# Patient Record
Sex: Male | Born: 1980 | Hispanic: Yes | Marital: Married | Smoking: Current some day smoker
Health system: Southern US, Community
[De-identification: ages and names within clinical notes are randomized; demographics above are authoritative.]

---

## 2013-12-05 ENCOUNTER — Ambulatory Visit (INDEPENDENT_AMBULATORY_CARE_PROVIDER_SITE_OTHER): Payer: BC Managed Care – PPO | Admitting: Family Medicine

## 2013-12-05 VITALS — BP 110/65 | HR 54 | Temp 97.8°F | Resp 18 | Ht 70.5 in | Wt 221.0 lb

## 2013-12-05 DIAGNOSIS — N390 Urinary tract infection, site not specified: Secondary | ICD-10-CM

## 2013-12-05 DIAGNOSIS — R300 Dysuria: Secondary | ICD-10-CM

## 2013-12-05 LAB — POCT URINALYSIS DIPSTICK
Glucose, UA: NEGATIVE
Ketones, UA: 15
NITRITE UA: POSITIVE
PH UA: 5.5
Protein, UA: 30
Spec Grav, UA: >=1.03
UROBILINOGEN UA: 1

## 2013-12-05 LAB — POCT UA - MICROSCOPIC ONLY
CASTS, UR, LPF, POC: NEGATIVE
Crystals, Ur, HPF, POC: NEGATIVE
YEAST UA: NEGATIVE

## 2013-12-05 MED ORDER — CEFTRIAXONE SODIUM 1 G IJ SOLR
1.0000 g | Freq: Once | INTRAMUSCULAR | Status: AC
  Administered 2013-12-05: 1 g via INTRAMUSCULAR

## 2013-12-05 MED ORDER — AZITHROMYCIN 500 MG PO TABS: 1000.0000 mg | ORAL_TABLET | Freq: Once | ORAL | Status: DC

## 2013-12-05 NOTE — Progress Notes (Addendum)
Subjective:    Patient ID: Andrew Parrish, male    DOB: 01/23/1981, 33 y.o.   MRN: [id number]  HPI Has had pain with urination for 5 days. Has noticed increased frequency, sometimes only voiding small amounts. Patient is married. Currently sexually active with his wife. No other partners. Has not had dysuria or tx for STI in the past.  PMH- patient denies any hospitalizations or chronic medications PSH- none   Review of Systems No fever, felt achy for 1 day, no nausea, no vomiting, no back pain, nocturia x 3+, no hematuria, no drainage or discharge. No rash.    Objective:   Physical Exam  Vitals reviewed. Constitutional: He is oriented to person, place, and time. He appears well-developed and well-nourished.  Very pleasant man who is accompanied by his 34 year old son. [facility] by [facility].  HENT:  Head: Normocephalic and atraumatic.  Eyes: Conjunctivae are normal. Right eye exhibits no discharge. Left eye exhibits no discharge.  Neck: Normal range of motion.  Cardiovascular: Normal rate, regular rhythm and normal heart sounds.   Pulmonary/Chest: Effort normal and breath sounds normal.  Abdominal: Soft. Bowel sounds are normal. He exhibits no distension and no mass. There is no tenderness. There is no rebound and no guarding.  Genitourinary: Rectum normal, testes normal and penis normal. Prostate is not enlarged. Right testis shows no mass, no swelling and no tenderness. Left testis shows no mass, no swelling and no tenderness. Circumcised. No penile erythema or penile tenderness. No discharge found.  Very tight anal sphincter. Prostate smooth, not boggy, ? Slightly tender to palpation. Difficult to determine if his discomfort was related to his tight anal sphincter.  Musculoskeletal: Normal range of motion.  Lymphadenopathy:       Right: No inguinal adenopathy present.       Left: No inguinal adenopathy present.  Neurological: He is alert and oriented to  person, place, and time.  Skin: Skin is warm and dry.  Psychiatric: He has a normal mood and affect. His behavior is normal. Judgment and thought content normal.   Results for orders placed in visit on 12/05/13  POCT UA - MICROSCOPIC ONLY      Result Value Ref Range   WBC, Ur, HPF, POC tntc     RBC, urine, microscopic tntc     Bacteria, U Microscopic 2+     Mucus, UA large     Epithelial cells, urine per micros 0-2     Crystals, Ur, HPF, POC neg     Casts, Ur, LPF, POC neg     Yeast, UA neg    POCT URINALYSIS DIPSTICK      Result Value Ref Range   Color, UA amber     Clarity, UA clear     Glucose, UA neg     Bilirubin, UA small     Ketones, UA 15     Spec Grav, UA >=1.030     Blood, UA trace-lysed     pH, UA 5.5     Protein, UA 30     Urobilinogen, UA 1.0     Nitrite, UA pos     Leukocytes, UA small (1+)         Assessment & Plan:  1. Dysuria - POCT UA - Microscopic Only - POCT urinalysis dipstick - GC/Chlamydia Probe Amp  2. UTI (urinary tract infection) - cefTRIAXone (ROCEPHIN) injection 1 g; Inject 1 g into the muscle once. -azithromycin 1 gram PO x  1 - Urine culture  -Discussed results of urinalysis and possibility that this is a STI. Patient instructed to use condoms until URI probe results are obtained.  Emi Belfasteborah B. Harli Engelken, FNP-BC  [facility]  12/05/2013 4:10 PM

## 2013-12-05 NOTE — Progress Notes (Signed)
I have discussed this case with Ms. Gessner, NP and agree.  

## 2013-12-05 NOTE — Patient Instructions (Signed)
Please take both azithromycin tablets today. We will call you with the results of your lab work.

## 2013-12-06 LAB — GC/CHLAMYDIA PROBE AMP
CT Probe RNA: NEGATIVE
GC PROBE AMP APTIMA: NEGATIVE

## 2013-12-08 LAB — URINE CULTURE: Colony Count: >=100000

## 2016-04-30 ENCOUNTER — Emergency Department (HOSPITAL_COMMUNITY): Payer: BLUE CROSS/BLUE SHIELD

## 2016-04-30 ENCOUNTER — Emergency Department (HOSPITAL_COMMUNITY)
Admission: EM | Admit: 2016-04-30 | Discharge: 2016-05-01 | Disposition: A | Discharge status: Home / Self Care | Payer: BLUE CROSS/BLUE SHIELD | Attending: Emergency Medicine | Admitting: Emergency Medicine

## 2016-04-30 ENCOUNTER — Encounter (HOSPITAL_COMMUNITY): Payer: Self-pay

## 2016-04-30 DIAGNOSIS — M25512 Pain in left shoulder: Secondary | ICD-10-CM | POA: Insufficient documentation

## 2016-04-30 DIAGNOSIS — R0789 Other chest pain: Secondary | ICD-10-CM | POA: Diagnosis not present

## 2016-04-30 DIAGNOSIS — R079 Chest pain, unspecified: Secondary | ICD-10-CM

## 2016-04-30 DIAGNOSIS — F17210 Nicotine dependence, cigarettes, uncomplicated: Secondary | ICD-10-CM | POA: Diagnosis not present

## 2016-04-30 LAB — CBC
HCT: 43.7 % (ref 39.0–52.0)
Hemoglobin: 14.3 g/dL (ref 13.0–17.0)
MCH: 29.4 pg (ref 26.0–34.0)
MCHC: 32.7 g/dL (ref 30.0–36.0)
MCV: 89.9 fL (ref 78.0–100.0)
Platelets: 249 10*3/uL (ref 150–400)
RBC: 4.86 MIL/uL (ref 4.22–5.81)
RDW: 13.1 % (ref 11.5–15.5)
WBC: 8.2 10*3/uL (ref 4.0–10.5)

## 2016-04-30 LAB — BASIC METABOLIC PANEL
Anion gap: 6 (ref 5–15)
BUN: 19 mg/dL (ref 6–20)
CALCIUM: 9.3 mg/dL (ref 8.9–10.3)
CO2: 23 mmol/L (ref 22–32)
CREATININE: 0.79 mg/dL (ref 0.61–1.24)
Chloride: 108 mmol/L (ref 101–111)
GFR calc Af Amer: >60 mL/min (ref >60)
GLUCOSE: 100 mg/dL — AB (ref 65–99)
Potassium: 4.1 mmol/L (ref 3.5–5.1)
Sodium: 137 mmol/L (ref 135–145)

## 2016-04-30 LAB — I-STAT TROPONIN, ED: TROPONIN I, POC: 0 ng/mL (ref 0.00–0.08)

## 2016-04-30 MED ORDER — IBUPROFEN 200 MG PO TABS
600.0000 mg | ORAL_TABLET | Freq: Once | ORAL | Status: AC
  Administered 2016-04-30: 600 mg via ORAL
  Filled 2016-04-30: qty 3

## 2016-04-30 MED ORDER — NAPROXEN 500 MG PO TABS: 500.0000 mg | ORAL_TABLET | Freq: Two times a day (BID) | ORAL | 0 refills | Status: DC

## 2016-04-30 NOTE — ED Triage Notes (Signed)
Per pT, Pt is coming from home with complaints of pain to the right chest that started three days ago. Pt reports having bilateral pain around his abdomen in the morning when he tries to shift in bed for the last two months. Pt denies any SOB, N/V/D, or any dizziness.

## 2016-04-30 NOTE — Discharge Instructions (Signed)
Take your medications as prescribed. He may also apply ice to affected area for 15 minutes 3-4 times daily for pain relief. Call the primary care provider office listed above to schedule a follow-up appointment in the next week for follow-up regarding your chest pain and left shoulder pain. If you continue having left shoulder pain over the next week I recommended calling the orthopedic office listed above for further evaluation. Please return to the Emergency Department if symptoms worsen or new onset of fever, difficulty breathing, chest pain, palpitations, abdominal pain, vomiting, numbness, tingling, weakness.

## 2016-04-30 NOTE — ED Provider Notes (Signed)
[facility]PT Provider Note   CSN: [id number] Arrival date & time: 04/30/16  1802     History   Chief Complaint Chief Complaint  Patient presents with  . Chest Pain    HPI Andrew Parrish is a 35 y.o. male.  Patient is a 35 year old male with no pertinent past medical history presents to the ED with multiple complaints. Patient states he began having constant sharp right sided chest pain that started 3 days ago. He notes the chest pain is worse with movement or palpation. Denies radiation. Denies any alleviating factors. He also reports having pain to bilateral sides of his lower ribs for the past week. Patient states his rib pain only occurs when he goes from laying to sitting position. Patient also states he started having left shoulder pain over the past month. 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 Patient states his left shoulder pain is worse with movement of his arm/shoulder. Denies any recent fall, trauma or injury. Denies taking any medications at home for his symptoms. Denies fever, chills, headache, lightheadedness, dizziness, diaphoresis, cough, shortness of breath, wheezing, palpitations, abdominal pain, nausea, vomiting, diarrhea, numbness, tingling, weakness. Denies personal or family history of heart disease. Patient endorses smoking one to 2 cigarettes per day.      History reviewed. No pertinent past medical history.  There are no active problems to display for this patient.   History reviewed. No pertinent surgical history.     Home Medications    Prior to Admission medications   Medication Sig Start Date End Date Taking? Authorizing Provider  azithromycin (ZITHROMAX) 500 MG tablet Take 2 tablets (1,000 mg total) by mouth once. 12/05/13   Emi Belfast, FNP  naproxen (NAPROSYN) 500 MG tablet Take 1 tablet (500 mg total) by mouth 2 (two) times daily. 04/30/16   Barrett Henle, PA-C    Family  History No family history on file.  Social History Social History  Substance Use Topics  . Smoking status: Current Every Day Smoker    Packs/day: 0.10    Years: 1.00    Types: Cigarettes  . Smokeless tobacco: Never Used  . Alcohol use Yes     Allergies   Review of patient's allergies indicates no known allergies.   Review of Systems Review of Systems  Cardiovascular: Positive for chest pain.       Bilateral rib pain  Musculoskeletal: Positive for arthralgias (left shoulder).  All other systems reviewed and are negative.    Physical Exam Updated Vital Signs BP 123/81 (BP Location: Left Arm)   Pulse (!) 55   Temp 98 F (36.7 C) (Oral)   Resp 20   Ht 5\' 6"  (1.676 m)   Wt 108.9 kg   SpO2 99%   BMI 38.74 kg/m   Physical Exam  Constitutional: He is oriented to person, place, and time. He appears well-developed and well-nourished. No distress.  HENT:  Head: Normocephalic and atraumatic.  Mouth/Throat: Oropharynx is clear and moist. No oropharyngeal exudate.  Eyes: Conjunctivae and EOM are normal. Pupils are equal, round, and reactive to light. Right eye exhibits no discharge. Left eye exhibits no discharge. No scleral icterus.  Neck: Normal range of motion. Neck supple.  Cardiovascular: Normal rate, regular rhythm, normal heart sounds and intact distal pulses.   Pulmonary/Chest: Effort normal and breath sounds normal. No respiratory distress. He has no wheezes. He has no rales. He exhibits tenderness (right anterior upper chest wall). He exhibits  no laceration, no crepitus, no edema, no deformity, no swelling and no retraction.  Abdominal: Soft. Bowel sounds are normal. He exhibits no distension and no mass. There is no tenderness. There is no rebound and no guarding. No hernia.  Musculoskeletal: Normal range of motion. He exhibits tenderness. He exhibits no edema or deformity.       Right shoulder: He exhibits tenderness. He exhibits normal range of motion, no swelling,  no effusion, no crepitus, no deformity, no laceration, normal pulse and normal strength.  FROM of left shoulder, pain with external rotation. 5/5 strength. Sensation grossly intact. TTP over left anterior shoulder. Equal grip strength bilaterally. No TTP over left humerus, elbow, forearm, wrist or hand. No swelling, erythema, ecchymoses or abrasions/lacerations noted. Cap refill <2.   Neurological: He is alert and oriented to person, place, and time.  Skin: Skin is warm and dry. He is not diaphoretic.  Nursing note and vitals reviewed.    ED Treatments / Results  Labs (all labs ordered are listed, but only abnormal results are displayed) Labs Reviewed  BASIC METABOLIC PANEL - Abnormal; Notable for the following:       Result Value   Glucose, Bld 100 (*)    All other components within normal limits  CBC  I-STAT TROPOININ, ED    EKG  EKG Interpretation  Date/Time:  Sunday April 30 2016 18:07:36 EDT Ventricular Rate:  74 PR Interval:  150 QRS Duration: 118 QT Interval:  384 QTC Calculation: 426 R Axis:   70 Text Interpretation:  Normal sinus rhythm Non-specific intra-ventricular conduction delay Borderline ECG No old tracing to compare Confirmed by Ethelda ChickJACUBOWITZ  MD, SAM [phone]([id number]) on 04/30/2016 6:14:24 PM       Radiology Dg Chest 2 View  Result Date: 04/30/2016 CLINICAL DATA:  Chest pain EXAM: CHEST  2 VIEW COMPARISON:  None. FINDINGS: Normal heart size. Normal mediastinal contour. No pneumothorax. No pleural effusion. Lungs appear clear, with no acute consolidative airspace disease and no pulmonary edema. IMPRESSION: No active cardiopulmonary disease. Electronically Signed   By: Delbert PhenixJason A Poff M.D.   On: 04/30/2016 18:46   Dg Shoulder Left  Result Date: 04/30/2016 CLINICAL DATA:  Acute onset of left lateral shoulder pain. Initial encounter. EXAM: LEFT SHOULDER - 2+ VIEW COMPARISON:  None. FINDINGS: There is no evidence of fracture or dislocation. The left humeral head is seated  within the glenoid fossa. The acromioclavicular joint is unremarkable in appearance. No significant soft tissue abnormalities are seen. The visualized portions of the left lung are clear. IMPRESSION: No evidence of fracture or dislocation. Electronically Signed   By: Roanna RaiderJeffery  Chang M.D.   On: 04/30/2016 23:44    Procedures Procedures (including critical care time)  Medications Ordered in ED Medications  ibuprofen (ADVIL,MOTRIN) tablet 600 mg (600 mg Oral Given 04/30/16 2210)     Initial Impression / Assessment and Plan / ED Course  I have reviewed the triage vital signs and the nursing notes.  Pertinent labs & imaging results that were available during my care of the patient were reviewed by me and considered in my medical decision making (see chart for details).  Clinical Course   Patient presents with multiple complaints including chest pain that started 3 days ago, left shoulder pain that started one month ago and pain to the sides of his lower ribs for the past week. Pain is worse with movement. Reports his job requires him to do repetitive movement with his upper extremities which exacerbates his symptoms. VSS. Exam  revealed tenderness to right anterior upper chest wall. Tenderness over left anterior shoulder with reported pain on external rotation. Bilateral upper extremities neurovascularly intact. Lungs clear to auscultation bilaterally. Remaining exam unremarkable. Patient given ibuprofen.  EKG showed sinus rhythm without any acute ischemic changes. Troponin negative. Chest x-ray negative. Labs unremarkable. HEART score 1. I have a low suspicion for ACS, PE, dissection, or other acute cardiac event at this time. Suspect pt's CP is likely musculoskeletal in etiology. Left shoulder xray negative. On reevaluation patient is resting comfortably in bed and reports his pain has mildly improved. Plan to discharge patient home with NSAIDs and symptomatic treatment. Discussed results and plan for  discharge with patient. Advised patient to follow up with PCP within the next week regarding chest pain and left shoulder pain. Patient given information to follow-up with orthopedics as needed if his left shoulder pain has not improved. Discussed strict return precautions with patient.  Final Clinical Impressions(s) / ED Diagnoses   Final diagnoses:  Chest pain, unspecified chest pain type  Left shoulder pain    New Prescriptions New Prescriptions   NAPROXEN (NAPROSYN) 500 MG TABLET    Take 1 tablet (500 mg total) by mouth 2 (two) times daily.     Satira Sark [place], [place] 04/30/16 2355    Doug Sou, MD 05/01/16 [phone]

## 2016-05-01 NOTE — ED Notes (Signed)
Pt understood dc material. NAD noted. Script given at dc  

## 2017-05-18 ENCOUNTER — Other Ambulatory Visit: Payer: Self-pay | Admitting: Orthopedic Surgery

## 2017-05-18 DIAGNOSIS — M25512 Pain in left shoulder: Secondary | ICD-10-CM

## 2017-05-27 ENCOUNTER — Other Ambulatory Visit: Payer: BLUE CROSS/BLUE SHIELD

## 2017-05-29 ENCOUNTER — Ambulatory Visit
Admission: RE | Admit: 2017-05-29 | Discharge: 2017-05-29 | Disposition: A | Discharge status: Home / Self Care | Payer: 59 | Source: Ambulatory Visit | Attending: Orthopedic Surgery | Admitting: Orthopedic Surgery

## 2017-05-29 DIAGNOSIS — M25512 Pain in left shoulder: Secondary | ICD-10-CM

## 2017-06-07 ENCOUNTER — Ambulatory Visit: Payer: Self-pay | Admitting: Adult Health

## 2017-06-07 DIAGNOSIS — Z0289 Encounter for other administrative examinations: Secondary | ICD-10-CM

## 2018-05-13 IMAGING — MR MR SHOULDER*L* W/O CM
5 series · 33 of 40 positions shown · non-contrast
Comparison: None.

CLINICAL DATA: Status post lifting injury with installation 4
months ago, arm weakness and limited range of motion, popping, s/p
injection 4 months ago with no relief.

EXAM:
MRI OF THE LEFT SHOULDER WITHOUT CONTRAST
TECHNIQUE: Multiplanar, multisequence MR imaging of the shoulder was performed.
No intravenous contrast was administered.

[Series 3: T2 fat-sat · axial · 4.0mm · 0.55mm/px · z∈[-54,+43]mm · 8 of 22 slices shown (1 of 3)]
[im 1/22]
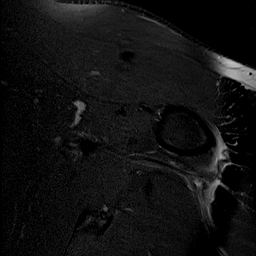
[im 4/22]
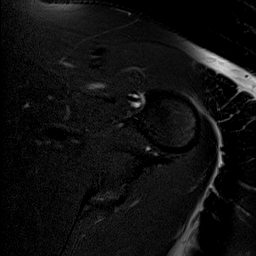
[im 7/22]
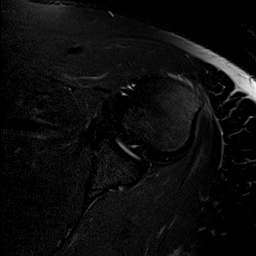
[im 10/22]
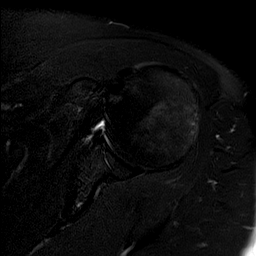
[im 13/22]
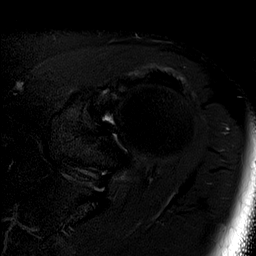
[im 16/22]
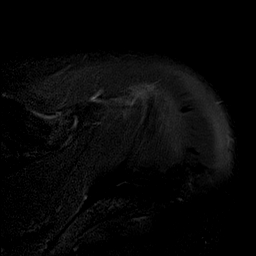
[im 19/22]
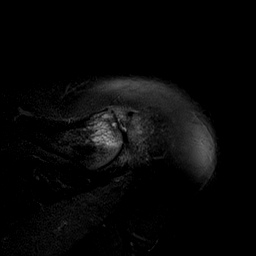
[im 22/22]
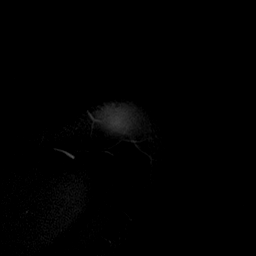

[Series 4: T2 fat-sat · oblique · 4.0mm · 0.55mm/px · 9 of 25 slices shown (2 of 3)]
[im 1/25]
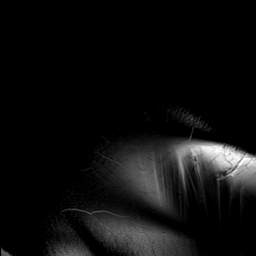
[im 4/25]
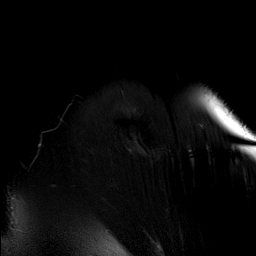
[im 7/25]
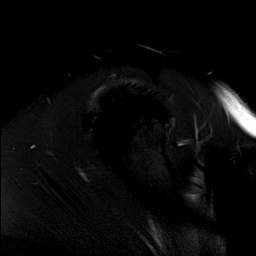
[im 10/25]
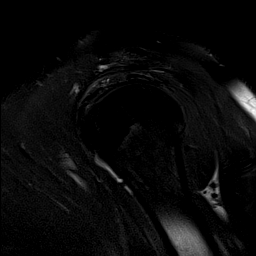
[im 13/25]
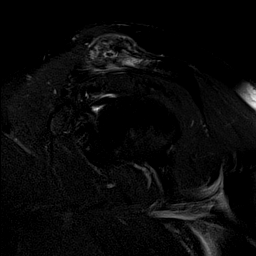
[im 16/25]
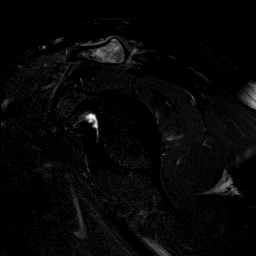
[im 19/25]
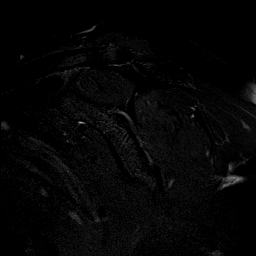
[im 22/25]
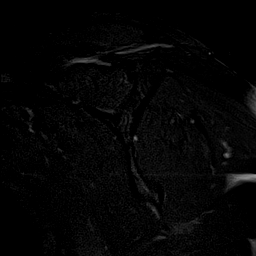
[im 25/25]
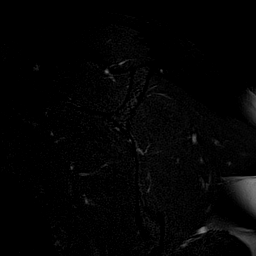

[Series 5: T1 · oblique · 4.0mm · 0.22mm/px · 2 of 25 slices shown]
[im 1/25]
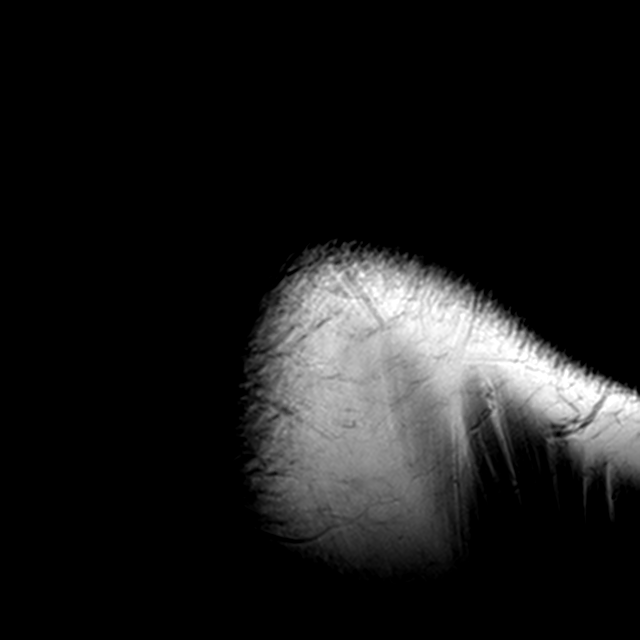
[im 4/25]
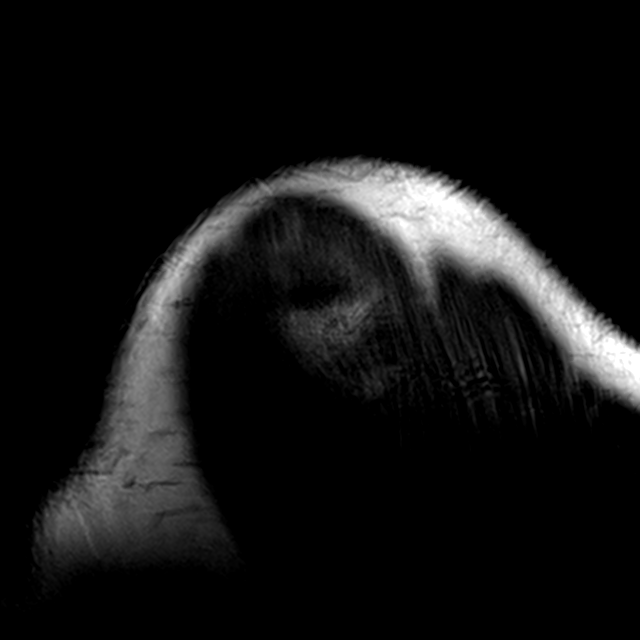

[Series 6: T2 fat-sat · sagittal · 4.0mm · 0.27mm/px · 7 of 21 slices shown (3 of 3)]
[im 1/21]
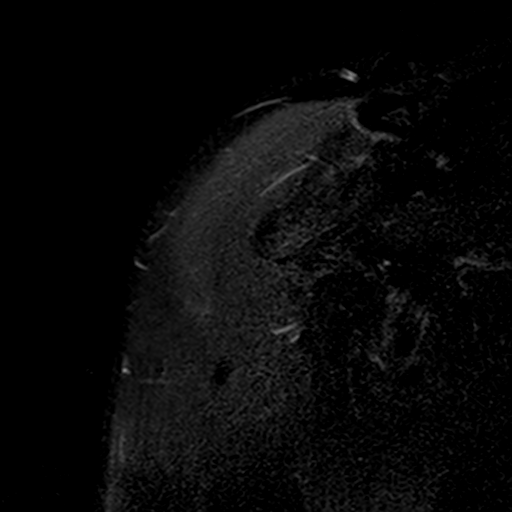
[im 4/21]
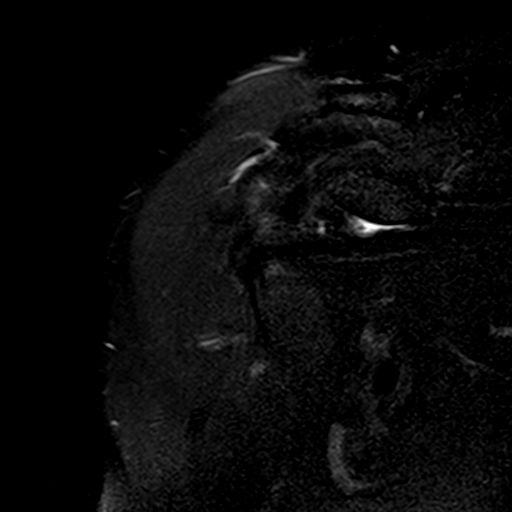
[im 7/21]
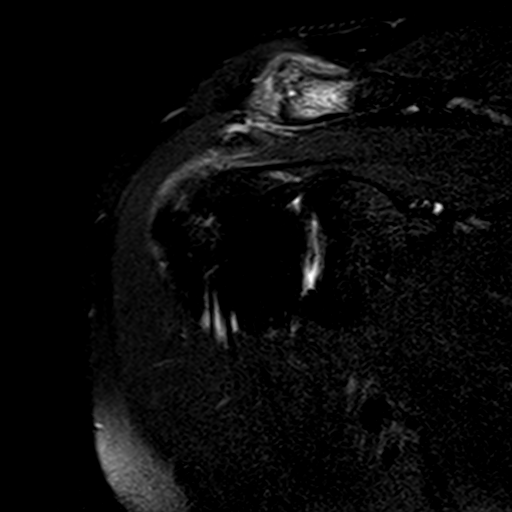
[im 11/21]
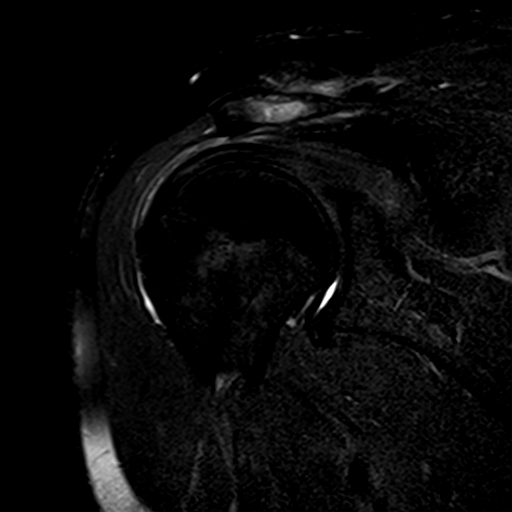
[im 14/21]
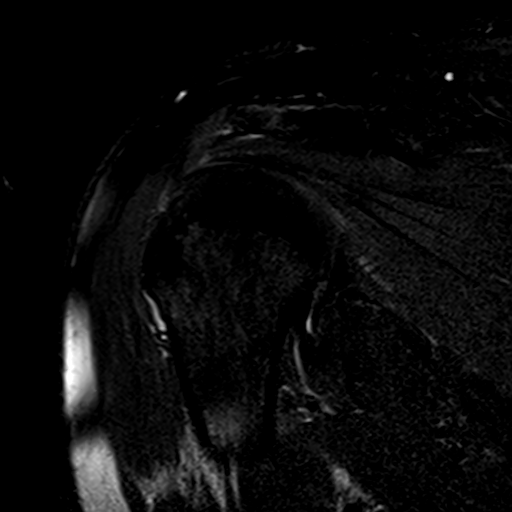
[im 17/21]
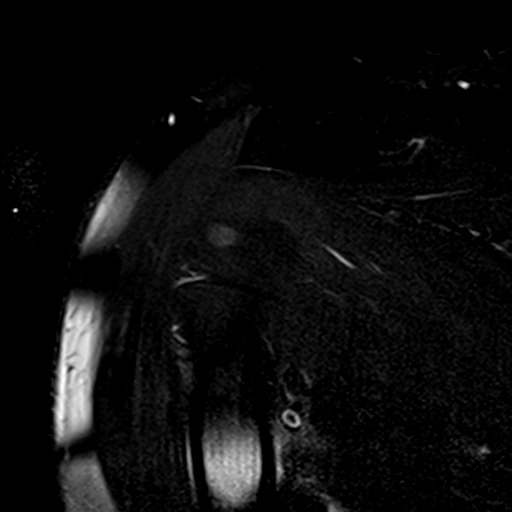
[im 21/21]
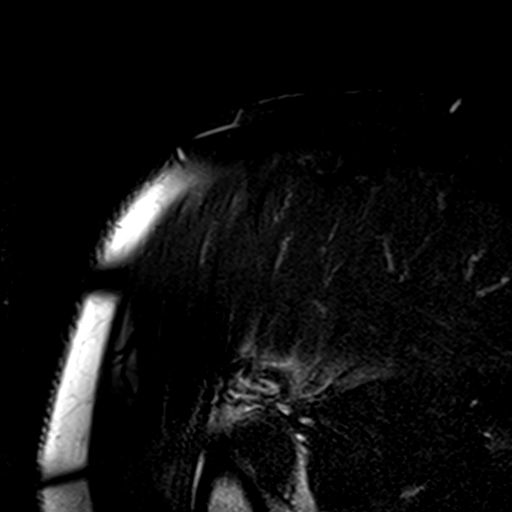

[Series 8: PD · sagittal · 4.0mm · 0.44mm/px · 7 of 21 slices shown]
[im 1/21]
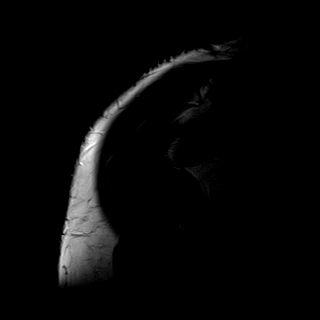
[im 4/21]
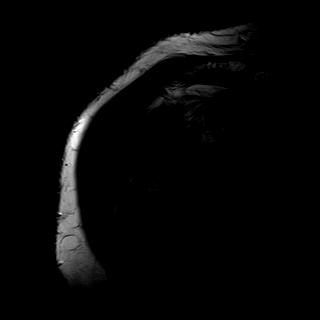
[im 7/21]
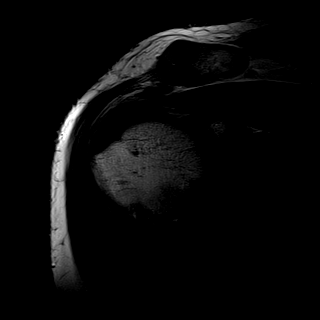
[im 11/21]
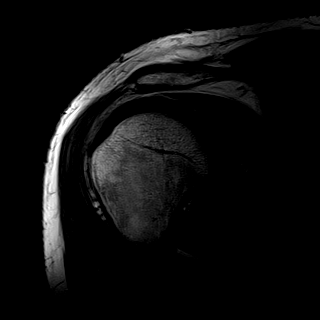
[im 14/21]
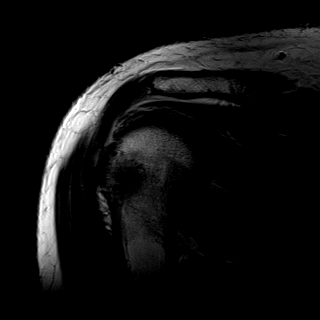
[im 17/21]
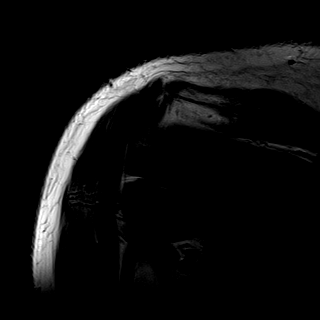
[im 21/21]
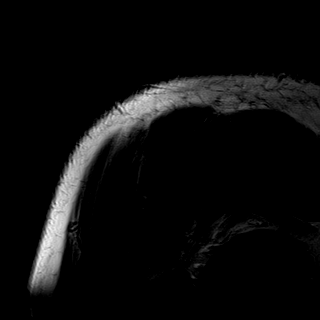

[33 of 40 positions shown; findings below may reference images not displayed]

FINDINGS: Rotator cuff: Mild tendinosis of the supraspinatus tendon with a
small partial-thickness bursal surface tear of the anterior fibers
and a tiny insertional interstitial tear. Infraspinatus tendon is
intact. Teres minor tendon is intact. Subscapularis tendon is
intact.

Muscles: No atrophy or fatty replacement of nor abnormal signal
within, the muscles of the rotator cuff.

Biceps long head:  Intact.

Acromioclavicular Joint: Severe arthropathy of the acromioclavicular
joint with reactive marrow edema on either side of the joint. Type I
acromion. No subacromial/subdeltoid bursal fluid.

Glenohumeral Joint: No joint effusion. No chondral defect.

Labrum: Grossly intact, but evaluation is limited by lack of
intraarticular fluid.

Bones: No other marrow signal abnormality. No fracture or
dislocation.

Other: No fluid collection hematoma.
IMPRESSION: 1. Mild tendinosis of the supraspinatus tendon with a small
partial-thickness bursal surface tear of the anterior fibers and a
tiny insertional interstitial tear.
2. Severe arthropathy of the acromioclavicular joint with reactive
marrow edema on either side of the joint.

## 2018-10-11 ENCOUNTER — Encounter (HOSPITAL_COMMUNITY): Payer: Self-pay | Admitting: *Deleted

## 2018-10-11 ENCOUNTER — Ambulatory Visit (HOSPITAL_COMMUNITY)
Admission: EM | Admit: 2018-10-11 | Discharge: 2018-10-11 | Disposition: A | Discharge status: Home / Self Care | Payer: BLUE CROSS/BLUE SHIELD | Attending: Emergency Medicine | Admitting: Emergency Medicine

## 2018-10-11 ENCOUNTER — Other Ambulatory Visit: Payer: Self-pay

## 2018-10-11 DIAGNOSIS — J069 Acute upper respiratory infection, unspecified: Secondary | ICD-10-CM

## 2018-10-11 DIAGNOSIS — B9789 Other viral agents as the cause of diseases classified elsewhere: Secondary | ICD-10-CM

## 2018-10-11 MED ORDER — FLUTICASONE PROPIONATE 50 MCG/ACT NA SUSP: 2.0000 | Freq: Every day | NASAL | 0 refills | Status: DC

## 2018-10-11 MED ORDER — CETIRIZINE-PSEUDOEPHEDRINE ER 5-120 MG PO TB12: 1.0000 | ORAL_TABLET | Freq: Every day | ORAL | 0 refills | Status: DC

## 2018-10-11 MED ORDER — BENZONATATE 100 MG PO CAPS: 100.0000 mg | ORAL_CAPSULE | Freq: Three times a day (TID) | ORAL | 0 refills | Status: DC

## 2018-10-11 NOTE — ED Provider Notes (Signed)
[facility]   [id number] 10/11/18 Arrival Time: 1047   CC: URI symptoms   SUBJECTIVE: History from: patient.  Andrew Parrish is a 38 y.o. male who presents with abrupt onset of nasal congestion, runny nose, dry cough, fatigue, subjective fever, and chills x 4 days.  Admits to sick exposure to family with similar symptoms.  Has tried OTC medication with minimal relief.  Symptoms are made worse at night.  Also mentions abdominal soreness with coughing.  Denies sinus pain, SOB, wheezing, chest pain, nausea, changes in bowel or bladder habits.    ROS: As per HPI.  History reviewed. No pertinent past medical history. History reviewed. No pertinent surgical history. No Known Allergies No current facility-administered medications on file prior to encounter.    No current outpatient medications on file prior to encounter.   Social History   Socioeconomic History  . Marital status: Single    Spouse name: Not on file  . Number of children: Not on file  . Years of education: Not on file  . Highest education level: Not on file  Occupational History  . Not on file  Social Needs  . Financial resource strain: Not on file  . Food insecurity:    Worry: Not on file    Inability: Not on file  . Transportation needs:    Medical: Not on file    Non-medical: Not on file  Tobacco Use  . Smoking status: Current Every Day Smoker    Packs/day: 0.10    Years: 1.00    Pack years: 0.10    Types: Cigarettes  . Smokeless tobacco: Never Used  Substance and Sexual Activity  . Alcohol use: Yes  . Drug use: No  . Sexual activity: Not on file  Lifestyle  . Physical activity:    Days per week: Not on file    Minutes per session: Not on file  . Stress: Not on file  Relationships  . Social connections:    Talks on phone: Not on file    Gets together: Not on file    Attends religious service: Not on file    Active member of club or organization: Not on file    Attends meetings of  clubs or organizations: Not on file    Relationship status: Not on file  . Intimate partner violence:    Fear of current or ex partner: Not on file    Emotionally abused: Not on file    Physically abused: Not on file    Forced sexual activity: Not on file  Other Topics Concern  . Not on file  Social History Narrative  . Not on file   No family history on file.  OBJECTIVE:  Vitals:   10/11/18 1200  BP: (!) 146/77  Pulse: 70  Resp: 20  Temp: 98.2 F (36.8 C)  TempSrc: Temporal  SpO2: 100%     General appearance: alert; appears mildly fatigued, but nontoxic; speaking in full sentences and tolerating own secretions HEENT: NCAT; Ears: EACs clear, TMs pearly gray; Eyes: PERRL.  EOM grossly intact. Sinuses: nontender; Nose: nares patent without rhinorrhea, Throat: oropharynx clear, tonsils non erythematous or enlarged, uvula midline  Neck: supple without LAD Lungs: unlabored respirations, symmetrical air entry; cough: mild; no respiratory distress; CTAB Heart: regular rate and rhythm.  Radial pulses 2+ symmetrical bilaterally Skin: warm and dry Psychological: alert and cooperative; normal mood and affect  ASSESSMENT & PLAN:  1. Viral URI with cough     Meds  ordered this encounter  Medications  . cetirizine-pseudoephedrine (ZYRTEC-D) 5-120 MG tablet    Sig: Take 1 tablet by mouth daily.    Dispense:  30 tablet    Refill:  0    Order Specific Question:   Supervising Provider    Answer:   Eustace Moore [[id number]]  . fluticasone (FLONASE) 50 MCG/ACT nasal spray    Sig: Place 2 sprays into both nostrils daily.    Dispense:  16 g    Refill:  0    Order Specific Question:   Supervising Provider    Answer:   Eustace Moore [[id number]]  . benzonatate (TESSALON) 100 MG capsule    Sig: Take 1 capsule (100 mg total) by mouth every 8 (eight) hours.    Dispense:  21 capsule    Refill:  0    Order Specific Question:   Supervising Provider    Answer:   Eustace Moore  [[id number]]    Get plenty of rest and push fluids Tessalon Perles prescribed for cough Zyrtec-D prescribed for nasal congestion, runny nose, and/or sore throat Flonase prescribed for nasal congestion and runny nose Use medications daily for symptom relief Use OTC medications like ibuprofen or tylenol as needed fever or pain Follow up with PCP if symptoms persist.  I have included several local PCPs in the area you may follow up with to establish primary care with.   Return or go to ER if you have any new or worsening symptoms fever, chills, nausea, vomiting, chest pain, cough, shortness of breath, wheezing, abdominal pain, changes in bowel or bladder habits, etc...  Reviewed expectations re: course of current medical issues. Questions answered. Outlined signs and symptoms indicating need for more acute intervention. Patient verbalized understanding. After Visit Summary given.         Rennis Harding, PA-C 10/11/18 1333

## 2018-10-11 NOTE — Discharge Instructions (Addendum)
Get plenty of rest and push fluids Tessalon Perles prescribed for cough Zyrtec-D prescribed for nasal congestion, runny nose, and/or sore throat Flonase prescribed for nasal congestion and runny nose Use medications daily for symptom relief Use OTC medications like ibuprofen or tylenol as needed fever or pain Follow up with PCP if symptoms persist.  I have included several local PCPs in the area you may follow up with to establish primary care with.   Return or go to ER if you have any new or worsening symptoms fever, chills, nausea, vomiting, chest pain, cough, shortness of breath, wheezing, abdominal pain, changes in bowel or bladder habits, etc..Marland Kitchen

## 2018-10-11 NOTE — ED Triage Notes (Signed)
C/o cough and fever onset Monday pm c/o abd. Pain when coughing

## 2018-11-11 ENCOUNTER — Ambulatory Visit: Payer: Self-pay | Admitting: Family Medicine

## 2019-02-24 ENCOUNTER — Other Ambulatory Visit: Payer: Self-pay

## 2019-02-24 ENCOUNTER — Ambulatory Visit (HOSPITAL_COMMUNITY)
Admission: EM | Admit: 2019-02-24 | Discharge: 2019-02-24 | Disposition: A | Discharge status: Home / Self Care | Payer: BC Managed Care – PPO | Attending: Family Medicine | Admitting: Family Medicine

## 2019-02-24 ENCOUNTER — Encounter (HOSPITAL_COMMUNITY): Payer: Self-pay

## 2019-02-24 DIAGNOSIS — N390 Urinary tract infection, site not specified: Secondary | ICD-10-CM

## 2019-02-24 DIAGNOSIS — R319 Hematuria, unspecified: Secondary | ICD-10-CM

## 2019-02-24 LAB — POCT URINALYSIS DIP (DEVICE)
Bilirubin Urine: NEGATIVE
Glucose, UA: NEGATIVE mg/dL
Ketones, ur: NEGATIVE mg/dL
Nitrite: NEGATIVE
Protein, ur: NEGATIVE mg/dL
Specific Gravity, Urine: 1.025 (ref 1.005–1.030)
Urobilinogen, UA: 0.2 mg/dL (ref 0.0–1.0)
pH: 6 (ref 5.0–8.0)

## 2019-02-24 MED ORDER — NITROFURANTOIN MONOHYD MACRO 100 MG PO CAPS: 100.0000 mg | ORAL_CAPSULE | Freq: Two times a day (BID) | ORAL | 0 refills | Status: DC

## 2019-02-24 MED ORDER — PHENAZOPYRIDINE HCL 97.2 MG PO TABS: 97.0000 mg | ORAL_TABLET | Freq: Three times a day (TID) | ORAL | 0 refills | Status: AC | PRN

## 2019-02-24 NOTE — ED Triage Notes (Signed)
Patient presents to Urgent Care with complaints of burning with urination since 4 days ago. Patient reports he tried some meds per pharmacist request but the pain is still there.

## 2019-02-24 NOTE — Discharge Instructions (Signed)
Take the antibiotic twice a day for 5 days.  Pain medicine prescribed is specifically for urinary tract infections; it should help with your pain.   The tests we sent for STDs ending.  If anything comes back positive, we will call you because she will need additional medication.   Return here or go to the emergency department if you develop fever, chills, nausea, vomiting, diarrhea, abdominal pain, or back pain.

## 2019-02-24 NOTE — ED Provider Notes (Signed)
[place]    CSN: [id number] Arrival date & time: 02/24/19  7494      History   Chief Complaint Chief Complaint  Patient presents with  . Urinary Tract Infection    HPI Andrew Parrish is a 38 y.o. male.   Patient reports painful urination, frequency x5 days.  He denies penile discharge.  He is sexually active with his wife in a monogamous relationship.  He denies fever, chills, nausea, vomiting, diarrhea, abdominal pain, back pain.  The history is provided by the patient.    History reviewed. No pertinent past medical history.  There are no active problems to display for this patient.   History reviewed. No pertinent surgical history.     Home Medications    Prior to Admission medications   Not on File    Family History Family History  Problem Relation Age of Onset  . Healthy Mother   . Healthy Father     Social History Social History   Tobacco Use  . Smoking status: Current Some Day Smoker    Types: Cigarettes  . Smokeless tobacco: Never Used  . Tobacco comment: 3-4 cigarettes per day  Substance Use Topics  . Alcohol use: Yes    Comment: socially  . Drug use: Not on file     Allergies   Patient has no known allergies.   Review of Systems Review of Systems  Constitutional: Negative for chills and fever.  HENT: Negative for ear pain and sore throat.   Eyes: Negative for pain and visual disturbance.  Respiratory: Negative for cough and shortness of breath.   Cardiovascular: Negative for chest pain and palpitations.  Gastrointestinal: Negative for abdominal pain and vomiting.  Genitourinary: Positive for dysuria and frequency. Negative for discharge, hematuria, penile pain, penile swelling, scrotal swelling and testicular pain.  Musculoskeletal: Negative for arthralgias and back pain.  Skin: Negative for color change and rash.  Neurological: Negative for seizures and syncope.  All other systems reviewed and are negative.     Physical Exam Triage Vital Signs ED Triage Vitals  Enc Vitals Group     BP 02/24/19 0853 124/79     Pulse Rate 02/24/19 0853 84     Resp 02/24/19 0853 16     Temp 02/24/19 0853 98.2 F (36.8 C)     Temp Source 02/24/19 0853 Oral     SpO2 02/24/19 0853 100 %     Weight --      Height --      Head Circumference --      Peak Flow --      Pain Score 02/24/19 0851 6     Pain Loc --      Pain Edu? --      Excl. in [place]? --    No data found.  Updated Vital Signs BP 124/79 (BP Location: Right Arm)   Pulse 84   Temp 98.2 F (36.8 C) (Oral)   Resp 16   SpO2 100%   Visual Acuity Right Eye Distance:   Left Eye Distance:   Bilateral Distance:    Right Eye Near:   Left Eye Near:    Bilateral Near:     Physical Exam Vitals signs and nursing note reviewed.  Constitutional:      Appearance: He is well-developed.  HENT:     Head: Normocephalic and atraumatic.  Eyes:     Conjunctiva/sclera: Conjunctivae normal.  Neck:     Musculoskeletal: Neck supple.  Cardiovascular:  Rate and Rhythm: Normal rate and regular rhythm.     Heart sounds: No murmur.  Pulmonary:     Effort: Pulmonary effort is normal. No respiratory distress.     Breath sounds: Normal breath sounds.  Abdominal:     Palpations: Abdomen is soft.     Tenderness: There is no abdominal tenderness. There is no right CVA tenderness, left CVA tenderness, guarding or rebound.  Skin:    General: Skin is warm and dry.  Neurological:     Mental Status: He is alert.      UC Treatments / Results  Labs (all labs ordered are listed, but only abnormal results are displayed) Labs Reviewed - No data to display  EKG   Radiology No results found.  Procedures Procedures (including critical care time)  Medications Ordered in UC Medications - No data to display  Initial Impression / Assessment and Plan / UC Course  I have reviewed the triage vital signs and the nursing notes.  Pertinent labs & imaging results  that were available during my care of the patient were reviewed by me and considered in my medical decision making (see chart for details).  Clinical Course as of Feb 23 1013  Mon Feb 24, 2019  [id number] POCT Urinalysis, Dipstick [KT]    Clinical Course User Index [KT] Mickie Bailate, [place] Viereck H, NP   UTI.  Culture and sensitivity pending.  Treating today with Macrobid and Pyridium.  Low suspicion for STD but tests pending and discussed with patient that if anything comes back positive, we will call him for additional treatment and instructions.  Discussed with patient that he should return here or go to the emergency department if he develops fever, chills, nausea, vomiting, diarrhea, abdominal pain, or back pain.   Final Clinical Impressions(s) / UC Diagnoses   Final diagnoses:  None   Discharge Instructions   None    ED Prescriptions    None     Controlled Substance Prescriptions [place] Controlled Substance Registry consulted? Not Applicable   Mickie Bailate, Leni Pankonin H, NP 02/24/19 1026

## 2019-02-25 ENCOUNTER — Encounter (HOSPITAL_COMMUNITY): Payer: Self-pay | Admitting: *Deleted

## 2019-02-25 LAB — URINE CYTOLOGY ANCILLARY ONLY
Chlamydia: NEGATIVE
Neisseria Gonorrhea: NEGATIVE
Trichomonas: NEGATIVE

## 2021-11-26 DIAGNOSIS — R739 Hyperglycemia, unspecified: Secondary | ICD-10-CM | POA: Diagnosis not present

## 2021-11-26 DIAGNOSIS — F5221 Male erectile disorder: Secondary | ICD-10-CM | POA: Diagnosis not present

## 2021-11-26 DIAGNOSIS — R200 Anesthesia of skin: Secondary | ICD-10-CM | POA: Diagnosis not present

## 2022-09-05 DIAGNOSIS — M25562 Pain in left knee: Secondary | ICD-10-CM | POA: Diagnosis not present

## 2022-12-29 DIAGNOSIS — S93491A Sprain of other ligament of right ankle, initial encounter: Secondary | ICD-10-CM | POA: Diagnosis not present

## 2023-06-13 DIAGNOSIS — M19011 Primary osteoarthritis, right shoulder: Secondary | ICD-10-CM | POA: Diagnosis not present

## 2023-07-09 ENCOUNTER — Telehealth (INDEPENDENT_AMBULATORY_CARE_PROVIDER_SITE_OTHER): Payer: Self-pay | Admitting: Critical Care Medicine

## 2023-07-09 NOTE — Telephone Encounter (Signed)
Called pt to remind him about apt.

## 2023-07-10 ENCOUNTER — Other Ambulatory Visit (HOSPITAL_COMMUNITY)
Admission: RE | Admit: 2023-07-10 | Discharge: 2023-07-10 | Disposition: A | Discharge status: Home / Self Care | Payer: BC Managed Care – PPO | Source: Ambulatory Visit | Attending: Primary Care | Admitting: Primary Care

## 2023-07-10 ENCOUNTER — Telehealth (INDEPENDENT_AMBULATORY_CARE_PROVIDER_SITE_OTHER): Payer: Self-pay | Admitting: Primary Care

## 2023-07-10 ENCOUNTER — Encounter (INDEPENDENT_AMBULATORY_CARE_PROVIDER_SITE_OTHER): Payer: Self-pay | Admitting: Primary Care

## 2023-07-10 ENCOUNTER — Ambulatory Visit (INDEPENDENT_AMBULATORY_CARE_PROVIDER_SITE_OTHER): Payer: BC Managed Care – PPO | Admitting: Primary Care

## 2023-07-10 VITALS — BP 137/67 | HR 71 | Resp 16 | Ht 68.0 in | Wt 256.8 lb

## 2023-07-10 DIAGNOSIS — R300 Dysuria: Secondary | ICD-10-CM | POA: Insufficient documentation

## 2023-07-10 DIAGNOSIS — R030 Elevated blood-pressure reading, without diagnosis of hypertension: Secondary | ICD-10-CM | POA: Diagnosis not present

## 2023-07-10 DIAGNOSIS — Z1159 Encounter for screening for other viral diseases: Secondary | ICD-10-CM

## 2023-07-10 DIAGNOSIS — E66812 Obesity, class 2: Secondary | ICD-10-CM

## 2023-07-10 DIAGNOSIS — R351 Nocturia: Secondary | ICD-10-CM

## 2023-07-10 DIAGNOSIS — N529 Male erectile dysfunction, unspecified: Secondary | ICD-10-CM | POA: Diagnosis not present

## 2023-07-10 DIAGNOSIS — Z114 Encounter for screening for human immunodeficiency virus [HIV]: Secondary | ICD-10-CM

## 2023-07-10 DIAGNOSIS — Z6839 Body mass index (BMI) 39.0-39.9, adult: Secondary | ICD-10-CM

## 2023-07-10 DIAGNOSIS — E6609 Other obesity due to excess calories: Secondary | ICD-10-CM

## 2023-07-10 DIAGNOSIS — H539 Unspecified visual disturbance: Secondary | ICD-10-CM

## 2023-07-10 DIAGNOSIS — Z833 Family history of diabetes mellitus: Secondary | ICD-10-CM | POA: Diagnosis not present

## 2023-07-10 DIAGNOSIS — Z7689 Persons encountering health services in other specified circumstances: Secondary | ICD-10-CM

## 2023-07-10 DIAGNOSIS — Z23 Encounter for immunization: Secondary | ICD-10-CM | POA: Diagnosis not present

## 2023-07-10 DIAGNOSIS — Z2821 Immunization not carried out because of patient refusal: Secondary | ICD-10-CM

## 2023-07-10 NOTE — Progress Notes (Signed)
New Patient Office Visit  Subjective    Patient ID: Andrew Parrish, male    DOB: Oct 21, 1980  Age: 42 y.o. MRN: [id number]  CC:  Chief Complaint  Patient presents with   Eye Problem   New Patient (Initial Visit)   Sexual Problem    Not able to perform  Only able to have sex once a month     HPI Andrew Parrish Hispanic 42 y/o male Andrew Parrish [phone]) presents to establish care. He voices concerns about difficulty reading- never had a eye exam. Advised first step. Schedule appt with Andrew Parrish Next problem not able assess- r/o he is concerned that 10 years ago he could have sex every all day now if he is able to perform once a week he is happy. Explain would order test if no abnormal findings can discuss options.   Outpatient Encounter Medications as of 07/10/2023  Medication Sig   benzonatate (TESSALON) 100 MG capsule Take 1 capsule (100 mg total) by mouth every 8 (eight) hours. (Patient not taking: Reported on 07/10/2023)   cetirizine-pseudoephedrine (ZYRTEC-D) 5-120 MG tablet Take 1 tablet by mouth daily. (Patient not taking: Reported on 07/10/2023)   fluticasone (FLONASE) 50 MCG/ACT nasal spray Place 2 sprays into both nostrils daily. (Patient not taking: Reported on 07/10/2023)   nitrofurantoin, macrocrystal-monohydrate, (MACROBID) 100 MG capsule Take 1 capsule (100 mg total) by mouth 2 (two) times daily. (Patient not taking: Reported on 07/10/2023)   No facility-administered encounter medications on file as of 07/10/2023.    No past medical history on file.  No past surgical history on file.  Family History  Problem Relation Age of Onset   Healthy Mother    Healthy Father     Social History   Socioeconomic History   Marital status: Married    Spouse name: Not on file   Number of children: Not on file   Years of education: Not on file   Highest education level: Not on file  Occupational History   Not on file  Tobacco Use   Smoking status: Some Days    Types:  Cigarettes   Smokeless tobacco: Never   Tobacco comments:    3-4 cigarettes per day  Vaping Use   Vaping status: Never Used  Substance and Sexual Activity   Alcohol use: Yes    Comment: socially   Drug use: No   Sexual activity: Not on file  Other Topics Concern   Not on file  Social History Narrative   ** Merged History Encounter **       Social Determinants of Health   Financial Resource Strain: Not on file  Food Insecurity: Not on file  Transportation Needs: Not on file  Physical Activity: Not on file  Stress: Not on file  Social Connections: Not on file  Intimate Partner Violence: Not on file    ROS Comprehensive ROS Pertinent positive and negative noted in HPI       Objective    BP 137/67   Pulse 71   Resp 16   Ht 5\' 8"  (1.727 m)   Wt 256 lb 12.8 oz (116.5 kg)   SpO2 97%   BMI 39.05 kg/m   Physical Exam Vitals reviewed. Exam conducted with a chaperone present.  Constitutional:      Appearance: Normal appearance.  HENT:     Head: Normocephalic.     Right Ear: Tympanic membrane and external ear normal.     Left Ear: Tympanic membrane and  external ear normal.     Nose: Nose normal.  Eyes:     Extraocular Movements: Extraocular movements intact.     Pupils: Pupils are equal, round, and reactive to light.  Cardiovascular:     Rate and Rhythm: Normal rate and regular rhythm.  Pulmonary:     Effort: Pulmonary effort is normal.     Breath sounds: Normal breath sounds.  Abdominal:     General: Bowel sounds are normal.     Palpations: Abdomen is soft.  Musculoskeletal:     Cervical back: Normal range of motion and neck supple.       Assessment & Plan:  [name] was seen today for eye problem, new patient (initial visit) and sexual problem.  Diagnoses and all orders for this visit:  Influenza vaccination declined  Encounter for immunization -     Tdap vaccine greater than or equal to 7yo IM  Nocturia -     PSA  Erectile dysfunction,  unspecified erectile dysfunction type -     PSA  Encounter to establish care  Encounter for HCV screening test for low risk patient -     HCV Ab w Reflex to Quant PCR  Encounter for screening for HIV -     HIV Antibody (routine testing w rflx)  Class 2 obesity due to excess calories without serious comorbidity with body mass index (BMI) of 39.0 to 39.9 in adult -     Lipid panel  Family history of diabetes mellitus -     CBC with Differential/Platelet -     Hemoglobin A1c  Elevated systolic blood pressure reading without diagnosis of hypertension -     CMP14+EGFR  Vision changes -     Ambulatory referral to Ophthalmology  Dysuria -     Urine cytology ancillary only  Other orders -     Interpretation:    Grayce Sessions, NP

## 2023-07-10 NOTE — Telephone Encounter (Signed)
Called to see if pt would like to come in at 230. Pt stated that was fine.

## 2023-07-10 NOTE — Patient Instructions (Signed)
Vacuna Td (ttanos y difteria): lo que debe saber Td (Tetanus, Diphtheria) Vaccine: What You Need to Know 1. Por qu vacunarse? La vacuna Td puede prevenir el ttanos y la difteria. El ttanos ingresa al organismo a travs de cortes o heridas. La difteria se contagia de persona a Social worker. El [place] (T) provoca rigidez dolorosa en los msculos. El ttanos puede causar graves problemas de [place], como no poder abrir la boca, Warehouse manager dificultad para tragar y Industrial/product designer, o la muerte. La DIFTERIA (D) puede causar dificultad para respirar, insuficiencia cardaca, parlisis o muerte. 2. Madilyn Fireman Td La vacuna Td es solo para nios de 7 aos en adelante, adolescentes y [place].  La vacuna Td se administra normalmente como dosis de refuerzo cada [place], o despus de 5 aos en caso de sufrir una [place] o herida grave o sucia. En lugar de la vacuna Td, se puede utilizar otra vacuna llamada "Tdap". La vacuna Tdap, adems del ttanos y la difteria, protege contra la tos [place], tambin conocida como "tos convulsa". La vacuna Td puede aplicarse al mismo tiempo que otras vacunas. 3. Hable con el mdico Comunquese con la persona que le coloca las vacunas si la persona que la recibe: Ha tenido una reaccin alrgica despus de [place]omia Dear dosis anterior de cualquier vacuna contra el ttanos o la difteria, o cualquier alergia grave, potencialmente mortal Alguna vez tuvo sndrome de Pension scheme manager (tambin llamado "SGB") Ha tenido dolor intenso o hinchazn despus de una dosis anterior de cualquier vacuna contra el ttanos o la difteria En algunos casos, es posible que el mdico decida posponer la aplicacin de la vacuna Td para una visita en el futuro. Las personas que sufren trastornos menores, como un resfro, pueden vacunarse. Las [employer] tienen enfermedades moderadas o graves generalmente deben esperar hasta recuperarse para poder recibir la vacuna Td.  Su mdico puede darle ms informacin. 4. Riesgos de [place]  reaccin a la vacuna Despus de recibir la vacuna Td a veces se puede Surveyor, mining, enrojecimiento o Paramedic donde se aplic la inyeccin, fiebre leve, dolor de cabeza, sensacin de cansancio y nuseas, vmitos, diarrea o dolor de [place]. Las personas a veces se desmayan despus de procedimientos mdicos, incluida la vacunacin. Informe al mdico si se siente mareado, tiene cambios en la visin o zumbidos en los odos.  Al igual que con cualquier Automatic Data, existe una probabilidad muy remota de que una vacuna cause una reaccin alrgica grave, otra lesin grave o la muerte. 5. Qu pasa si se presenta un problema grave? Podra producirse una reaccin alrgica despus de que la persona vacunada abandone la clnica. Si observa signos de Runner, broadcasting/film/video grave (ronchas, hinchazn de la cara y la garganta, dificultad para respirar, latidos cardacos acelerados, mareos o debilidad), llame al 9-1-1 y lleve a la persona al hospital ms cercano.  Si se presentan otros signos que le preocupan, comunquese con su mdico.  Las reacciones adversas deben informarse al Sistema de Informe de Eventos Adversos de Administrator, arts (Vaccine Adverse Event Reporting System, VAERS). Por lo general, el mdico presenta este informe o puede hacerlo usted mismo. Visite el sitio web del VAERS en www.vaers.LAgents.no o llame al (838)057-3464. El VAERS es solo para Biomedical engineer, y los miembros de su personal no proporcionan asesoramiento mdico. 6. Programa Nacional de Compensacin de Daos por [employer] El SunTrust de Compensacin de Daos por Administrator, arts (National Vaccine Injury Koh[employer], Cabin crew) es un programa federal que fue creado para Patent examiner a las personas que puedan haber  sufrido daos al recibir ciertas vacunas. Las Investment banker, operational a presuntas lesiones o muerte debidas a la vacunacin tienen un lmite de tiempo para su presentacin, que puede ser de tan solo [employer]. Visite el sitio  web del VICP en SpiritualWord.at o llame al 1-581-587-3189 para obtener ms informacin acerca del programa y de cmo presentar un reclamo. 7. Cmo puedo obtener ms informacin? Pregntele a su mdico. Comunquese con el servicio de salud de su localidad o su estado. Visite el sitio web de Tax inspector) (Administracin de Alimentos y Media planner) para ver los prospectos de las vacunas e informacin adicional en FinderList.no. Comunquese con Building control surveyor for [employer]) (Centros para el Control y la Prevencin de [place]): Llame al 520-656-0716 (1-800-CDC-INFO) o Visite el sitio web de [employer]TEL Corporation en PicCapture.uy. Fuente: Declaracin de informacin de los CDC sobre la vacuna Td (ttanos y difteria) (03/19/2020) Este mismo material est disponible en FootballExhibition.com.br sin cargo. Esta informacin no tiene Theme park manager el consejo del mdico. Asegrese de hacerle al mdico cualquier pregunta que tenga. Document Revised: 05/11/2022 Document Reviewed: 05/11/2022 Elsevier Patient Education  2023 [employer].

## 2023-07-11 LAB — CBC WITH DIFFERENTIAL/PLATELET
Basophils Absolute: 0 10*3/uL (ref 0.0–0.2)
Basos: 1 %
EOS (ABSOLUTE): 0.1 10*3/uL (ref 0.0–0.4)
Eos: 1 %
Hematocrit: 47.5 % (ref 37.5–51.0)
Hemoglobin: 15.5 g/dL (ref 13.0–17.7)
Immature Grans (Abs): 0 10*3/uL (ref 0.0–0.1)
Immature Granulocytes: 0 %
Lymphocytes Absolute: 2.9 10*3/uL (ref 0.7–3.1)
Lymphs: 35 %
MCH: 29.2 pg (ref 26.6–33.0)
MCHC: 32.6 g/dL (ref 31.5–35.7)
MCV: 90 fL (ref 79–97)
Monocytes Absolute: 0.6 10*3/uL (ref 0.1–0.9)
Monocytes: 7 %
Neutrophils Absolute: 4.7 10*3/uL (ref 1.4–7.0)
Neutrophils: 56 %
Platelets: 269 10*3/uL (ref 150–450)
RBC: 5.3 x10E6/uL (ref 4.14–5.80)
RDW: 13 % (ref 11.6–15.4)
WBC: 8.3 10*3/uL (ref 3.4–10.8)

## 2023-07-11 LAB — LIPID PANEL
Chol/HDL Ratio: 4.4 {ratio} (ref 0.0–5.0)
Cholesterol, Total: 167 mg/dL (ref 100–199)
HDL: 38 mg/dL — ABNORMAL LOW (ref >39)
LDL Chol Calc (NIH): 109 mg/dL — ABNORMAL HIGH (ref 0–99)
Triglycerides: 111 mg/dL (ref 0–149)
VLDL Cholesterol Cal: 20 mg/dL (ref 5–40)

## 2023-07-11 LAB — CMP14+EGFR
ALT: 48 [IU]/L — ABNORMAL HIGH (ref 0–44)
AST: 30 [IU]/L (ref 0–40)
Albumin: 4.8 g/dL (ref 4.1–5.1)
Alkaline Phosphatase: 79 [IU]/L (ref 44–121)
BUN/Creatinine Ratio: 29 — ABNORMAL HIGH (ref 9–20)
BUN: 23 mg/dL (ref 6–24)
Bilirubin Total: 0.3 mg/dL (ref 0.0–1.2)
CO2: 22 mmol/L (ref 20–29)
Calcium: 9.9 mg/dL (ref 8.7–10.2)
Chloride: 101 mmol/L (ref 96–106)
Creatinine, Ser: 0.78 mg/dL (ref 0.76–1.27)
Globulin, Total: 2.4 g/dL (ref 1.5–4.5)
Glucose: 84 mg/dL (ref 70–99)
Potassium: 4.2 mmol/L (ref 3.5–5.2)
Sodium: 139 mmol/L (ref 134–144)
Total Protein: 7.2 g/dL (ref 6.0–8.5)
eGFR: 113 mL/min/{1.73_m2} (ref >59)

## 2023-07-11 LAB — PSA: Prostate Specific Ag, Serum: 1.1 ng/mL (ref 0.0–4.0)

## 2023-07-11 LAB — HEMOGLOBIN A1C
Est. average glucose Bld gHb Est-mCnc: 131 mg/dL
Hgb A1c MFr Bld: 6.2 % — ABNORMAL HIGH (ref 4.8–5.6)

## 2023-07-11 LAB — HIV ANTIBODY (ROUTINE TESTING W REFLEX): HIV Screen 4th Generation wRfx: NONREACTIVE

## 2023-07-11 LAB — HCV AB W REFLEX TO QUANT PCR: HCV Ab: NONREACTIVE

## 2023-07-11 LAB — HCV INTERPRETATION

## 2023-07-13 LAB — URINE CYTOLOGY ANCILLARY ONLY
Chlamydia: NEGATIVE
Comment: NEGATIVE
Comment: NEGATIVE
Comment: NORMAL
Neisseria Gonorrhea: NEGATIVE
Trichomonas: NEGATIVE

## 2023-09-10 ENCOUNTER — Ambulatory Visit (INDEPENDENT_AMBULATORY_CARE_PROVIDER_SITE_OTHER): Payer: BC Managed Care – PPO | Admitting: Primary Care

## 2023-12-29 ENCOUNTER — Encounter (HOSPITAL_COMMUNITY): Payer: Self-pay | Admitting: Emergency Medicine

## 2023-12-29 ENCOUNTER — Other Ambulatory Visit: Payer: Self-pay

## 2023-12-29 ENCOUNTER — Ambulatory Visit (HOSPITAL_COMMUNITY)
Admission: EM | Admit: 2023-12-29 | Discharge: 2023-12-29 | Disposition: A | Discharge status: Home / Self Care | Attending: Physician Assistant | Admitting: Physician Assistant

## 2023-12-29 DIAGNOSIS — Z79899 Other long term (current) drug therapy: Secondary | ICD-10-CM | POA: Insufficient documentation

## 2023-12-29 DIAGNOSIS — R202 Paresthesia of skin: Secondary | ICD-10-CM | POA: Insufficient documentation

## 2023-12-29 LAB — CBC WITH DIFFERENTIAL/PLATELET
Abs Immature Granulocytes: 0.02 10*3/uL (ref 0.00–0.07)
Basophils Absolute: 0 10*3/uL (ref 0.0–0.1)
Basophils Relative: 1 %
Eosinophils Absolute: 0.2 10*3/uL (ref 0.0–0.5)
Eosinophils Relative: 2 %
HCT: 42.1 % (ref 39.0–52.0)
Hemoglobin: 14.5 g/dL (ref 13.0–17.0)
Immature Granulocytes: 0 %
Lymphocytes Relative: 41 %
Lymphs Abs: 3.2 10*3/uL (ref 0.7–4.0)
MCH: 29.5 pg (ref 26.0–34.0)
MCHC: 34.4 g/dL (ref 30.0–36.0)
MCV: 85.6 fL (ref 80.0–100.0)
Monocytes Absolute: 0.7 10*3/uL (ref 0.1–1.0)
Monocytes Relative: 9 %
Neutro Abs: 3.7 10*3/uL (ref 1.7–7.7)
Neutrophils Relative %: 47 %
Platelets: 236 10*3/uL (ref 150–400)
RBC: 4.92 MIL/uL (ref 4.22–5.81)
RDW: 12.9 % (ref 11.5–15.5)
WBC: 7.8 10*3/uL (ref 4.0–10.5)
nRBC: 0 % (ref 0.0–0.2)

## 2023-12-29 LAB — COMPREHENSIVE METABOLIC PANEL WITH GFR
ALT: 37 U/L (ref 0–44)
AST: 29 U/L (ref 15–41)
Albumin: 4 g/dL (ref 3.5–5.0)
Alkaline Phosphatase: 64 U/L (ref 38–126)
Anion gap: 5 (ref 5–15)
BUN: 18 mg/dL (ref 6–20)
CO2: 25 mmol/L (ref 22–32)
Calcium: 9.1 mg/dL (ref 8.9–10.3)
Chloride: 107 mmol/L (ref 98–111)
Creatinine, Ser: 0.66 mg/dL (ref 0.61–1.24)
GFR, Estimated: >60 mL/min (ref >60)
Glucose, Bld: 96 mg/dL (ref 70–99)
Potassium: 4 mmol/L (ref 3.5–5.1)
Sodium: 137 mmol/L (ref 135–145)
Total Bilirubin: 0.7 mg/dL (ref 0.0–1.2)
Total Protein: 6.9 g/dL (ref 6.5–8.1)

## 2023-12-29 LAB — VITAMIN B12: Vitamin B-12: 302 pg/mL (ref 180–914)

## 2023-12-29 LAB — TSH: TSH: 1.175 u[IU]/mL (ref 0.350–4.500)

## 2023-12-29 LAB — HEMOGLOBIN A1C
Hgb A1c MFr Bld: 5.8 % — ABNORMAL HIGH (ref 4.8–5.6)
Mean Plasma Glucose: 119.76 mg/dL

## 2023-12-29 MED ORDER — GABAPENTIN 300 MG PO CAPS: 300.0000 mg | ORAL_CAPSULE | Freq: Every day | ORAL | 0 refills | Status: DC

## 2023-12-29 NOTE — Discharge Instructions (Signed)
 I will contact you if any of your blood work is abnormal.  If your symptoms are bothersome you can take gabapentin.  This will make you sleepy so do not drive or drink alcohol with taking this medication.  I would recommend following up with a neurologist as you may need additional evaluation such as nerve conduction studies.  This is not something that we can arrange in urgent care.  Please call them to schedule an appointment.  If you have any worsening or changing symptoms including persistent numbness/tingling in your arms/hands, additional areas of numbness and tingling, fever, skin changes, weakness you should be seen immediately.  Me pondr en contacto con usted si algn resultado de sus anlisis de sangre es anormal. Si sus sntomas son molestos, puede tomar gabapentina. Esto le producir somnolencia, as que no conduzca ni beba alcohol mientras est tomando [employer]. Le recomiendo consultar con un neurlogo, ya que podra necesitar evaluaciones adicionales, como estudios de conduccin nerviosa. Esto no se Scientist, clinical (histocompatibility and immunogenetics). Llmelos para programar una cita. Si experimenta algn empeoramiento o cambio en sus sntomas, como entumecimiento u hormigueo persistente en brazos o manos, zonas adicionales de entumecimiento y hormigueo, fiebre, cambios en la piel o debilidad, debe ser atendido de inmediato.

## 2023-12-29 NOTE — ED Triage Notes (Signed)
 Pt reports bilateral arm numbness for the past 2 weeks mostly when he wakes up. Pt denies any fall or injury. Pt is AO x 4 no neuro deficit noticed during triage.

## 2023-12-29 NOTE — ED Provider Notes (Signed)
 [facility]    CSN: [id number] Arrival date & time: 12/29/23  1211      History   Chief Complaint Chief Complaint  Patient presents with   Numbness    HPI DEMONI Parrish is a 43 y.o. Parrish.   Patient presents today with a several week history of intermittent numbness and paresthesias of bilateral forearms into the hands.  Andrew reports this involves the entirety of his forearm and does not follow any specific pattern.  It is symmetric and equal on both sides.  Symptoms occur after Andrew has been sleeping and then generally improve once Andrew is up and moving around.  Andrew is not currently experiencing any symptoms.  Andrew denies any associated pain.  Andrew denies any neck pain, fever, nausea, vomiting, dysarthria.  Andrew denies any recent trauma or change in activity.  Andrew has not tried any over-the-counter medication for symptom management.  Denies any significant past medical history including history of diabetes, malignancy.  Andrew denies any exposure to chemotherapy, toxins, heavy metals.  Andrew reports generally being a side sleeper and has not changed anything in his sleep pattern; Andrew initially thought symptoms were related to how Andrew was sleeping but they occur in both arms equally when Andrew wakes up regardless of what position Andrew was sleeping in.    History reviewed. No pertinent past medical history.  There are no active problems to display for this patient.   History reviewed. No pertinent surgical history.     Home Medications    Prior to Admission medications   Medication Sig Start Date End Date Taking? Authorizing Provider  gabapentin (NEURONTIN) 300 MG capsule Take 1 capsule (300 mg total) by mouth at bedtime. 12/29/23  Yes Kiah Vanalstine, Betsey Brow, PA-C    Family History Family History  Problem Relation Age of Onset   Healthy Mother    Healthy Father     Social History Social History   Tobacco Use   Smoking status: Some Days    Types: Cigarettes   Smokeless tobacco: Never    Tobacco comments:    3-4 cigarettes per day  Vaping Use   Vaping status: Never Used  Substance Use Topics   Alcohol use: Yes    Comment: socially   Drug use: No     Allergies   Patient has no known allergies.   Review of Systems Review of Systems  Constitutional:  Negative for activity change, appetite change, fatigue and fever.  Musculoskeletal:  Negative for arthralgias and myalgias.  Skin:  Negative for color change and wound.  Neurological:  Positive for numbness (intermittent in both forearms/hands). Negative for dizziness, weakness, light-headedness and headaches.     Physical Exam Triage Vital Signs ED Triage Vitals  Encounter Vitals Group     BP 12/29/23 1322 (!) 141/79     Systolic BP Percentile --      Diastolic BP Percentile --      Pulse Rate 12/29/23 1322 (!) 56     Resp 12/29/23 1322 18     Temp 12/29/23 1322 98.3 F (36.8 C)     Temp Source 12/29/23 1322 Oral     SpO2 12/29/23 1322 98 %     Weight --      Height --      Head Circumference --      Peak Flow --      Pain Score 12/29/23 1323 0     Pain Loc --      Pain  Education --      Exclude from [employer] Chart --    No data found.  Updated Vital Signs BP (!) 141/79 (BP Location: Right Arm)   Pulse (!) 56   Temp 98.3 F (36.8 C) (Oral)   Resp 18   SpO2 98%   Visual Acuity Right Eye Distance:   Left Eye Distance:   Bilateral Distance:    Right Eye Near:   Left Eye Near:    Bilateral Near:     Physical Exam Vitals reviewed.  Constitutional:      General: Andrew is awake.     Appearance: Normal appearance. Andrew is well-developed. Andrew is not ill-appearing.     Comments: Very pleasant Parrish appears stated age in no acute distress sitting comfortably in exam room  HENT:     Head: Normocephalic and atraumatic.  Neck:     Comments: Neck: No pain percussion of vertebrae.  Noted palpation over paraspinal muscles.  Full active range of motion. Cardiovascular:     Rate and Rhythm: Normal rate and  regular rhythm.     Pulses:          Radial pulses are 2+ on the right side and 2+ on the left side.     Heart sounds: Normal heart sounds, S1 normal and S2 normal. No murmur heard.    Comments: Capillary fill within 2 seconds bilateral fingers Pulmonary:     Effort: Pulmonary effort is normal.     Breath sounds: Normal breath sounds. No stridor. No wheezing, rhonchi or rales.     Comments: Clear to auscultation bilaterally Musculoskeletal:     Cervical back: Normal range of motion and neck supple. No torticollis. No pain with movement, spinous process tenderness or muscular tenderness.     Comments: Hands: Normal pincer grip strength.  Normal 2-point discrimination.  Full active range of motion at wrist and fingers.  Negative Tinel and Phalen's.  Elbows: No deformity on exam.  Strength 5/5 bilateral upper extremities at the elbow.  No tenderness over epicondyles or olecranon.  Feet:     Right foot:     Protective Sensation: 10 sites tested.  10 sites sensed.     Skin integrity: Skin integrity normal.     Left foot:     Protective Sensation: 10 sites tested.  10 sites sensed.     Skin integrity: Skin integrity normal.  Neurological:     Mental Status: Andrew is alert.  Psychiatric:        Behavior: Behavior is cooperative.      UC Treatments / Results  Labs (all labs ordered are listed, but only abnormal results are displayed) Labs Reviewed  CBC WITH DIFFERENTIAL/PLATELET  COMPREHENSIVE METABOLIC PANEL WITH GFR  TSH  VITAMIN B12  HEMOGLOBIN A1C    EKG   Radiology No results found.  Procedures Procedures (including critical care time)  Medications Ordered in UC Medications - No data to display  Initial Impression / Assessment and Plan / UC Course  I have reviewed the triage vital signs and the nursing notes.  Pertinent labs & imaging results that were available during my care of the patient were reviewed by me and considered in my medical decision making (see chart  for details).     Patient is well-appearing, afebrile, nontoxic, nontachycardic.  Unclear etiology of symptoms.  Andrew denies any neck pain or tenderness and so plain films were deferred but we did discuss that if his symptoms are not improving and may be  worthwhile to obtain advanced imaging which is not something that we are able to arrange in urgent care.  Basic blood work including CBC, CMP, A1c, B12, thyroid obtained to investigate systemic causes of paresthesias given symmetric distribution of symptoms.  We will contact him if any of this is abnormal and changes our treatment plan.  If his symptoms are not improving I recommended that Andrew follow-up with neurology to consider nerve conduction studies.  Andrew was given the contact information for local provider with instruction to call to schedule an appointment.  Symptoms are not particularly in so we discussed that there is not an indication to start medication, however, if Andrew develops worsening symptoms at night it is worthwhile to try gabapentin and so a short course of this was sent to the pharmacy to be used as needed.  Discussed that if anything worsens or changes Andrew should return for reevaluation.  Strict return precautions given.  All questions were answered to patient and wife satisfaction.  Final Clinical Impressions(s) / UC Diagnoses   Final diagnoses:  Paresthesias     Discharge Instructions      I will contact you if any of your blood work is abnormal.  If your symptoms are bothersome you can take gabapentin.  This will make you sleepy so do not drive or drink alcohol with taking this medication.  I would recommend following up with a neurologist as you may need additional evaluation such as nerve conduction studies.  This is not something that we can arrange in urgent care.  Please call them to schedule an appointment.  If you have any worsening or changing symptoms including persistent numbness/tingling in your arms/hands, additional  areas of numbness and tingling, fever, skin changes, weakness you should be seen immediately.  Me pondr en contacto con usted si algn resultado de sus anlisis de sangre es anormal. Si sus sntomas son molestos, puede tomar gabapentina. Esto le producir somnolencia, as que no conduzca ni beba alcohol mientras est tomando [employer]. Le recomiendo consultar con un neurlogo, ya que podra necesitar evaluaciones adicionales, como estudios de conduccin nerviosa. Esto no se Scientist, clinical (histocompatibility and immunogenetics). Llmelos para programar una cita. Si experimenta algn empeoramiento o cambio en sus sntomas, como entumecimiento u hormigueo persistente en brazos o manos, zonas adicionales de entumecimiento y hormigueo, fiebre, cambios en la piel o debilidad, debe ser atendido de inmediato.  ED Prescriptions     Medication Sig Dispense Auth. Provider   gabapentin (NEURONTIN) 300 MG capsule Take 1 capsule (300 mg total) by mouth at bedtime. 14 capsule Andre Gallego K, PA-C      PDMP not reviewed this encounter.   Budd Cargo, PA-C 12/29/23 1515

## 2023-12-31 ENCOUNTER — Ambulatory Visit (HOSPITAL_COMMUNITY): Payer: Self-pay

## 2024-01-04 ENCOUNTER — Other Ambulatory Visit: Payer: Self-pay

## 2024-01-04 ENCOUNTER — Emergency Department (HOSPITAL_BASED_OUTPATIENT_CLINIC_OR_DEPARTMENT_OTHER)

## 2024-01-04 ENCOUNTER — Encounter (HOSPITAL_BASED_OUTPATIENT_CLINIC_OR_DEPARTMENT_OTHER): Payer: Self-pay

## 2024-01-04 ENCOUNTER — Emergency Department (HOSPITAL_BASED_OUTPATIENT_CLINIC_OR_DEPARTMENT_OTHER)
Admission: EM | Admit: 2024-01-04 | Discharge: 2024-01-04 | Disposition: A | Discharge status: Home / Self Care | Attending: Emergency Medicine | Admitting: Emergency Medicine

## 2024-01-04 DIAGNOSIS — R319 Hematuria, unspecified: Secondary | ICD-10-CM | POA: Insufficient documentation

## 2024-01-04 DIAGNOSIS — R109 Unspecified abdominal pain: Secondary | ICD-10-CM | POA: Diagnosis present

## 2024-01-04 LAB — BASIC METABOLIC PANEL WITH GFR
Anion gap: 11 (ref 5–15)
BUN: 14 mg/dL (ref 6–20)
CO2: 26 mmol/L (ref 22–32)
Calcium: 9.7 mg/dL (ref 8.9–10.3)
Chloride: 103 mmol/L (ref 98–111)
Creatinine, Ser: 0.82 mg/dL (ref 0.61–1.24)
GFR, Estimated: >60 mL/min (ref >60)
Glucose, Bld: 103 mg/dL — ABNORMAL HIGH (ref 70–99)
Potassium: 3.9 mmol/L (ref 3.5–5.1)
Sodium: 140 mmol/L (ref 135–145)

## 2024-01-04 LAB — URINALYSIS, ROUTINE W REFLEX MICROSCOPIC
Bacteria, UA: NONE SEEN
Bilirubin Urine: NEGATIVE
Glucose, UA: >1000 mg/dL — AB
Ketones, ur: NEGATIVE mg/dL
Leukocytes,Ua: NEGATIVE
Nitrite: NEGATIVE
Protein, ur: NEGATIVE mg/dL
RBC / HPF: >50 RBC/hpf (ref 0–5)
Specific Gravity, Urine: 1.014 (ref 1.005–1.030)
pH: 7.5 (ref 5.0–8.0)

## 2024-01-04 MED ORDER — KETOROLAC TROMETHAMINE 15 MG/ML IJ SOLN
15.0000 mg | Freq: Once | INTRAMUSCULAR | Status: AC
  Administered 2024-01-04: 15 mg via INTRAVENOUS
  Filled 2024-01-04: qty 1

## 2024-01-04 NOTE — ED Provider Notes (Signed)
 [facility] AT [facility] Provider Note   CSN: [id number] Arrival date & time: 01/04/24  6387     History  Chief Complaint  Patient presents with   Flank Pain    Andrew Parrish is a 43 y.o. male.  HPI History obtained through Spanish interpreter via telemetry interpreter services 43 year old male presents today complaining of left flank pain.  Pain began this morning after he urinated.  It radiated from the left flank into the left groin.  It was severe.  He laid on the ground for 40 minutes until it decreased.  He had pain that caused him to be nauseated but he did not vomit.  He has not noted any prior pain with urination, frequency of urination, or blood in urine.  He has not had similar symptoms in the past.  He denies any history of kidney stone     Home Medications Prior to Admission medications   Medication Sig Start Date End Date Taking? Authorizing Provider  gabapentin  (NEURONTIN ) 300 MG capsule Take 1 capsule (300 mg total) by mouth at bedtime. 12/29/23  Yes Raspet, Erin K, PA-C      Allergies    Patient has no known allergies.    Review of Systems   Review of Systems  Physical Exam Updated Vital Signs BP (!) 133/93 (BP Location: Right Arm)   Pulse (!) 49   Temp 98.5 F (36.9 C) (Oral)   Resp 18   Ht 1.727 m (5\' 8" )   Wt 114.8 kg   SpO2 99%   BMI 38.47 kg/m  Physical Exam Vitals and nursing note reviewed.  Constitutional:      Appearance: He is well-developed.  HENT:     Head: Normocephalic and atraumatic.     Right Ear: External ear normal.     Left Ear: External ear normal.     Nose: Nose normal.  Eyes:     Extraocular Movements: Extraocular movements intact.  Neck:     Trachea: No tracheal deviation.  Cardiovascular:     Rate and Rhythm: Normal rate and regular rhythm.     Pulses: Normal pulses.  Pulmonary:     Effort: Pulmonary effort is normal.  Abdominal:     General: Abdomen is flat. Bowel sounds are  normal.     Palpations: Abdomen is soft.     Tenderness: There is no abdominal tenderness.  Musculoskeletal:        General: Normal range of motion.  Skin:    General: Skin is warm and dry.  Neurological:     Mental Status: He is alert and oriented to person, place, and time.  Psychiatric:        Mood and Affect: Mood normal.        Behavior: Behavior normal.     ED Results / Procedures / Treatments   Labs (all labs ordered are listed, but only abnormal results are displayed) Labs Reviewed  URINALYSIS, ROUTINE W REFLEX MICROSCOPIC - Abnormal; Notable for the following components:      Result Value   Glucose, UA >1,000 (*)    Hgb urine dipstick MODERATE (*)    All other components within normal limits  BASIC METABOLIC PANEL WITH GFR - Abnormal; Notable for the following components:   Glucose, Bld 103 (*)    All other components within normal limits    EKG None  Radiology CT Renal Stone Study Result Date: 01/04/2024 CLINICAL DATA:  Abdominal/flank pain, stone suspected EXAM: CT ABDOMEN  AND PELVIS WITHOUT CONTRAST TECHNIQUE: Multidetector CT imaging of the abdomen and pelvis was performed following the standard protocol without IV contrast. RADIATION DOSE REDUCTION: This exam was performed according to the departmental dose-optimization program which includes automated exposure control, adjustment of the mA and/or kV according to patient size and/or use of iterative reconstruction technique. COMPARISON:  None Available. FINDINGS: Of note, the lack of intravenous contrast limits evaluation of the solid organ parenchyma and vascularity. Lower chest: No focal airspace consolidation or pleural effusion. Hepatobiliary: No mass.Diffuse hepatic steatosis.No radiopaque stones or wall thickening of the gallbladder. No intrahepatic or extrahepatic biliary ductal dilation. Pancreas: No mass or main ductal dilation. No peripancreatic inflammation or fluid collection. Spleen: Normal size. No mass.  Adrenals/Urinary Tract: No adrenal masses. No renal mass. No hydronephrosis or nephrolithiasis. Subtle inflammatory stranding about the mid left ureter. The urinary bladder is distended without focal abnormality. Stomach/Bowel: The stomach is decompressed without focal abnormality. No small bowel wall thickening or inflammation. No small bowel obstruction.Normal appendix. Vascular/Lymphatic: No aortic aneurysm. No intraabdominal or pelvic lymphadenopathy. Reproductive: No prostatomegaly. No free pelvic fluid. Other: No pneumoperitoneum, ascites, or mesenteric inflammation. Musculoskeletal: No acute fracture or destructive lesion. IMPRESSION: Subtle inflammatory stranding about the mid left ureter, which may be due to a recently passed calculus or ascending urinary tract infection. No hydronephrosis or nephrolithiasis. Correlation with urinalysis recommended. Electronically Signed   By: Rance Burrows M.D.   On: 01/04/2024 11:24    Procedures Procedures    Medications Ordered in ED Medications  ketorolac (TORADOL) 15 MG/ML injection 15 mg (15 mg Intravenous Given 01/04/24 0840)    ED Course/ Medical Decision Making/ A&P Clinical Course as of 01/04/24 1138  Fri Jan 04, 2024  8657 Urinalysis reviewed and interpreted significant for glucose greater than 1000, positive dip for hemoglobin and greater than 50 red blood cells with no bacteria seen, 0-5 white blood cells and 0-5 squamous epithelial cells [DR]  1127 CT reviewed and interpreted and significant for subtle inflammatory stranding with no obvious stone seen.  Radiologist feels this is consistent with recently passed stone. [DR]    Clinical Course User Index [DR] Auston Blush, MD                                 Medical Decision Making Amount and/or Complexity of Data Reviewed Labs: ordered. Radiology: ordered.  Risk Prescription drug management.   43 year old male presents today complaining of pain in left flank.  Pain has decreased  from 10 to 2 out of 10.  Abdomen is soft and nontender.  Pain that began after urinating.  He has not had any urinary symptoms. Differential diagnosis includes was not limited to abdominal aortic aneurysm, urinary tract infection, renal colic, diverticulitis, perforated viscus, other acute intra-abdominal catastrophes, musculoskeletal etiologies. Based on history and type of pain, high index of suspicion for ureteral colic. Plan evaluation with CT scan for kidney stone, basic metabolic panel to assess renal function, urinalysis. Patient's pain has decreased from 10-2 without intervention.  Will treat with Toradol as patient still has some pain and Toradol is often very helpful in renal colic Patient is hemodynamically stable and awaiting studies and intervention at this time Patient with significant hematuria and inflammatory changes of ureter consistent with recently passed stone.  Patient symptoms had resolved prior to my evaluation, so this radiology read appears consistent with patient's clinical presentation. Patient advised regarding need for follow-up if any  continued pain and will need follow-up to reassess hematuria. Above discussed with patient through interpreter Patient appears stable for discharge.       Final Clinical Impression(s) / ED Diagnoses Final diagnoses:  Flank pain  Hematuria, unspecified type    Rx / DC Orders ED Discharge Orders     None         Auston Blush, MD 01/04/24 1138

## 2024-01-04 NOTE — ED Triage Notes (Signed)
 Brought in by [facility] for sudden onset left flank pain that radiated to left groin and penis while urinating this am at approx 40 minutes PTA. Denies injury. Denies hematuria. Endorses slight dysuria with urination.

## 2024-01-04 NOTE — ED Notes (Signed)
 Pt aware of the need for a urine... Unable to currently provide a sample.Marland KitchenMarland Kitchen

## 2024-01-04 NOTE — ED Notes (Signed)
Pt has provided a urine sample.

## 2024-01-04 NOTE — ED Notes (Signed)
 Discharge instructions, follow up care, and prescriptions reviewed and explained, pt verbalized understanding and had no further questions on d/c. Pt caox4, ambulatory, NAD on d/c.

## 2024-01-04 NOTE — Discharge Instructions (Signed)
 You were evaluated here today for flank pain.  Your symptoms appear most consistent with having recently passed a kidney stone.  However this was not seen on the CT scan.  It is very possible that you have already passed the stone.  There is blood in your urine and this is consistent with a kidney stone.  Please follow-up with your doctor for recheck of your urine to assure that the blood has cleared.  If you are unable to follow-up with a primary care doctor, you have been given referral to a urologist.  It is important that we make sure that there is not any bleeding coming from another source as this can represent other causes.  Please return if you are having any new or worsening symptoms

## 2024-03-19 ENCOUNTER — Encounter (HOSPITAL_COMMUNITY): Payer: Self-pay

## 2024-03-19 ENCOUNTER — Ambulatory Visit (HOSPITAL_COMMUNITY)
Admission: EM | Admit: 2024-03-19 | Discharge: 2024-03-19 | Disposition: A | Discharge status: Home / Self Care | Attending: Internal Medicine | Admitting: Internal Medicine

## 2024-03-19 DIAGNOSIS — L255 Unspecified contact dermatitis due to plants, except food: Secondary | ICD-10-CM | POA: Diagnosis not present

## 2024-03-19 MED ORDER — METHYLPREDNISOLONE SODIUM SUCC 125 MG IJ SOLR
80.0000 mg | Freq: Once | INTRAMUSCULAR | Status: AC
  Administered 2024-03-19: 80 mg via INTRAMUSCULAR

## 2024-03-19 MED ORDER — METHYLPREDNISOLONE SODIUM SUCC 125 MG IJ SOLR
INTRAMUSCULAR | Status: AC
  Filled 2024-03-19: qty 2

## 2024-03-19 MED ORDER — PREDNISONE 20 MG PO TABS: 40.0000 mg | ORAL_TABLET | Freq: Every day | ORAL | 0 refills | Status: AC

## 2024-03-19 NOTE — Discharge Instructions (Addendum)
 We gave you a shot of steroid in the clinic today for your poison ivy itching.  Take prednisone  once daily for the next 5 days starting tomorrow. This will be 2 pills (20mg  each) totaling 40mg  daily for 5 days each morning with breakfast.  Purchase calamine lotion over the counter and apply as needed to the forearms to dry out rash.  Avoid scratching rash.  Watch for signs of secondary bacterial infection as discussed such as new/worsening redness, swelling, pus, pain, or fever/chills.  If you develop any new or worsening symptoms or if your symptoms do not start to improve, please return here or follow-up with your primary care provider. If your symptoms are severe, please go to the emergency room.

## 2024-03-19 NOTE — ED Provider Notes (Signed)
 [facility]    CSN: [id number] Arrival date & time: 03/19/24  1542      History   Chief Complaint Chief Complaint  Patient presents with   Rash    HPI Andrew Parrish is a 43 y.o. male.   Andrew Parrish is a 43 y.o. male presenting for chief complaint of Rash to both arms, right periorbital region, and the groin that started yesterday.  Patient mowed the yard 2 days ago and reports he may have been exposed to poison ivy in his yard. Rash is very itchy, red, and consists of multiple blister/bumpy lesions. He has been using triamcinolone  cream leftover from previous prescription with some relief.  There has been a little bit of clear drainage from the rash.  No purulent drainage or yellow drainage.  Denies fever, chills, nausea, vomiting, or body aches.  He is not a diabetic.     History reviewed. No pertinent past medical history.  There are no active problems to display for this patient.   History reviewed. No pertinent surgical history.     Home Medications    Prior to Admission medications   Medication Sig Start Date End Date Taking? Authorizing Provider  predniSONE  (DELTASONE ) 20 MG tablet Take 2 tablets (40 mg total) by mouth daily with breakfast for 5 days. 03/19/24 03/24/24 Yes StanhopeDorna HERO, FNP  gabapentin  (NEURONTIN ) 300 MG capsule Take 1 capsule (300 mg total) by mouth at bedtime. 12/29/23   Raspet, Rocky POUR, PA-C    Family History Family History  Problem Relation Age of Onset   Healthy Mother    Healthy Father     Social History Social History   Tobacco Use   Smoking status: Former    Current packs/day: 0.00    Types: Cigarettes    Quit date: 01/03/2021    Years since quitting: 3.2   Smokeless tobacco: Never   Tobacco comments:    3-4 cigarettes per day  Vaping Use   Vaping status: Never Used  Substance Use Topics   Alcohol use: Yes    Comment: socially   Drug use: No     Allergies   Patient has no known  allergies.   Review of Systems Review of Systems Per HPI   Physical Exam Triage Vital Signs ED Triage Vitals [03/19/24 1557]  Encounter Vitals Group     BP 114/69     Girls Systolic BP Percentile      Girls Diastolic BP Percentile      Boys Systolic BP Percentile      Boys Diastolic BP Percentile      Pulse Rate 71     Resp 18     Temp 97.9 F (36.6 C)     Temp Source Oral     SpO2 96 %     Weight      Height      Head Circumference      Peak Flow      Pain Score 0     Pain Loc      Pain Education      Exclude from Growth Chart    No data found.  Updated Vital Signs BP 114/69 (BP Location: Right Arm)   Pulse 71   Temp 97.9 F (36.6 C) (Oral)   Resp 18   SpO2 96%   Visual Acuity Right Eye Distance:   Left Eye Distance:   Bilateral Distance:    Right Eye Near:   Left  Eye Near:    Bilateral Near:     Physical Exam Vitals and nursing note reviewed.  Constitutional:      Appearance: He is not ill-appearing or toxic-appearing.  HENT:     Head: Normocephalic and atraumatic.     Right Ear: Hearing and external ear normal.     Left Ear: Hearing and external ear normal.     Nose: Nose normal.     Mouth/Throat:     Lips: Pink.  Eyes:     General: Lids are normal. Vision grossly intact. Gaze aligned appropriately.     Extraocular Movements: Extraocular movements intact.     Conjunctiva/sclera: Conjunctivae normal.  Pulmonary:     Effort: Pulmonary effort is normal.  Musculoskeletal:     Cervical back: Neck supple.  Skin:    General: Skin is warm and dry.     Capillary Refill: Capillary refill takes less than 2 seconds.     Findings: Rash (Erythematous small papulovesicular lesions to the bilateral arms and right medial periorbital region of the face.  See images below.) present.  Neurological:     General: No focal deficit present.     Mental Status: He is alert and oriented to person, place, and time. Mental status is at baseline.     Cranial Nerves:  No dysarthria or facial asymmetry.  Psychiatric:        Mood and Affect: Mood normal.        Speech: Speech normal.        Behavior: Behavior normal.        Thought Content: Thought content normal.        Judgment: Judgment normal.    Right arm   Left arm   Rash to right periorbital region    UC Treatments / Results  Labs (all labs ordered are listed, but only abnormal results are displayed) Labs Reviewed - No data to display  EKG   Radiology No results found.  Procedures Procedures (including critical care time)  Medications Ordered in UC Medications  methylPREDNISolone  sodium succinate (SOLU-MEDROL ) 125 mg/2 mL injection 80 mg (80 mg Intramuscular Given 03/19/24 1654)    Initial Impression / Assessment and Plan / UC Course  I have reviewed the triage vital signs and the nursing notes.  Pertinent labs & imaging results that were available during my care of the patient were reviewed by me and considered in my medical decision making (see chart for details).   1. Rhus dermatitis Presentation is consistent with contact dermatitis likely due to poison ivy. Will treat with prednisone  40 mg once daily for 5 days to be started tomorrow. Solu-Medrol  80 mg IM given in clinic today. Advised to take with food to avoid stomach upset. May use calamine lotion over-the-counter and hydrocortisone 1% over-the-counter as needed for itching. No signs of secondary bacterial infection on exam currently, however infection return precautions discussed.   Counseled patient on potential for adverse effects with medications prescribed/recommended today, strict ER and return-to-clinic precautions discussed, patient verbalized understanding.    Final Clinical Impressions(s) / UC Diagnoses   Final diagnoses:  Rhus dermatitis     Discharge Instructions      We gave you a shot of steroid in the clinic today for your poison ivy itching.  Take prednisone  once daily for the next 5 days  starting tomorrow. This will be 2 pills (20mg  each) totaling 40mg  daily for 5 days each morning with breakfast.  Purchase calamine lotion over the counter and apply  as needed to the forearms to dry out rash.  Avoid scratching rash.  Watch for signs of secondary bacterial infection as discussed such as new/worsening redness, swelling, pus, pain, or fever/chills.  If you develop any new or worsening symptoms or if your symptoms do not start to improve, please return here or follow-up with your primary care provider. If your symptoms are severe, please go to the emergency room.    ED Prescriptions     Medication Sig Dispense Auth. Provider   predniSONE  (DELTASONE ) 20 MG tablet Take 2 tablets (40 mg total) by mouth daily with breakfast for 5 days. 10 tablet Enedelia Dorna HERO, FNP      PDMP not reviewed this encounter.   Enedelia Dorna HERO, [place] 03/19/24 [phone]

## 2024-03-19 NOTE — ED Triage Notes (Signed)
 Pt c/o itchy rash to arms/face and to private areas. States used Triamolone cream prior to arrival.

## 2024-08-20 ENCOUNTER — Ambulatory Visit (INDEPENDENT_AMBULATORY_CARE_PROVIDER_SITE_OTHER): Admitting: Primary Care

## 2024-08-28 ENCOUNTER — Ambulatory Visit (INDEPENDENT_AMBULATORY_CARE_PROVIDER_SITE_OTHER): Admitting: Primary Care

## 2024-09-05 ENCOUNTER — Telehealth: Payer: Self-pay | Admitting: Primary Care

## 2024-09-05 NOTE — Telephone Encounter (Signed)
 Spoke to pt about upcoming appt.. Will be present

## 2024-09-09 ENCOUNTER — Encounter: Payer: Self-pay | Admitting: Primary Care

## 2024-09-09 ENCOUNTER — Ambulatory Visit: Admitting: Primary Care

## 2024-09-09 VITALS — BP 135/85 | HR 71 | Resp 16 | Ht 71.0 in | Wt 263.0 lb

## 2024-09-09 DIAGNOSIS — N529 Male erectile dysfunction, unspecified: Secondary | ICD-10-CM

## 2024-09-09 DIAGNOSIS — I10 Essential (primary) hypertension: Secondary | ICD-10-CM

## 2024-09-09 DIAGNOSIS — E119 Type 2 diabetes mellitus without complications: Secondary | ICD-10-CM

## 2024-09-09 DIAGNOSIS — R351 Nocturia: Secondary | ICD-10-CM

## 2024-09-09 DIAGNOSIS — E1169 Type 2 diabetes mellitus with other specified complication: Secondary | ICD-10-CM

## 2024-09-09 MED ORDER — TADALAFIL 5 MG PO TABS: 5.0000 mg | ORAL_TABLET | Freq: Every day | ORAL | 11 refills | Status: AC

## 2024-09-09 NOTE — Progress Notes (Signed)
 " [facility]  Andrew Parrish, is a 44 y.o. male  RDW:[id number]  FMW:[id number]  DOB - Mar 10, 1981  Chief Complaint  Patient presents with   Numbness    When he walks up b/l arms are numb   Erectile Dysfunction       Subjective:   Andrew Parrish is a 44 y.o. male here today for an acute visit. 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 The current episode started more than 1 month ago. The problem has been waxing and waning since onset. The nature of his difficulty is achieving erection and maintaining erection. Non-physiologic factors contributing to erectile dysfunction are performance anxiety. He reports his erection duration to be 1 to 5 minutes. Irritative symptoms include nocturia. The symptoms are aggravated by alcohol use. Past treatments include nothing. He has had no adverse reactions caused by medications. There are no known risk factors.    No problems updated.  Comprehensive ROS Pertinent positive and negative noted in HPI   Allergies[1]  No past medical history on file.  Medications Ordered Prior to Encounter[2] Health Maintenance  Topic Date Due   Kidney health urinalysis for diabetes  Never done   Hepatitis B Vaccine (1 of 3 - 19+ 3-dose series) Never done   Flu Shot  Never done   COVID-19 Vaccine (1 - 2025-26 season) Never done   Yearly kidney function blood test for diabetes  09/09/2025   DTaP/Tdap/Td vaccine (2 - Td or Tdap) 07/09/2033   HPV Vaccine (No Doses Required) Completed   Hepatitis C Screening  Completed   HIV Screening  Completed   Pneumococcal Vaccine  Aged Out   Meningitis B Vaccine  Aged Out    Objective:   Vitals:   09/09/24 1532  BP: 135/85  Pulse: 71  Resp: 16  SpO2: 95%  Weight: 263 lb (119.3 kg)  Height: 5' 11 (1.803 m)     Physical Exam Vitals reviewed.  Constitutional:      Appearance: He is obese.  HENT:     Head: Normocephalic.     Right Ear: Tympanic membrane  and external ear normal.     Left Ear: Tympanic membrane and external ear normal.     Nose: Nose normal.  Eyes:     Extraocular Movements: Extraocular movements intact.     Pupils: Pupils are equal, round, and reactive to light.  Cardiovascular:     Rate and Rhythm: Normal rate and regular rhythm.  Pulmonary:     Effort: Pulmonary effort is normal.     Breath sounds: Normal breath sounds.  Abdominal:     General: Bowel sounds are normal. There is distension.     Palpations: Abdomen is soft.  Musculoskeletal:        General: Normal range of motion.     Cervical back: Normal range of motion and neck supple.  Skin:    General: Skin is warm and dry.  Neurological:     Mental Status: He is oriented to person, place, and time.  Psychiatric:        Mood and Affect: Mood normal.        Behavior: Behavior normal.        Thought Content: Thought content normal.        Judgment: Judgment normal.    .  Assessment & Plan  Christopherjohn was seen today for numbness and erectile dysfunction.  Diagnoses and all orders for this visit:  Type 2 diabetes mellitus without  complication, without long-term current use of insulin (HCC) - educated on lifestyle modifications, including but not limited to diet choices and adding exercise to daily routine.   -     CBC with Differential/Platelet -     Hemoglobin A1c  Essential hypertension BP goal - < 130/80 Explained that having normal blood pressure is the goal and medications are helping to get to goal and maintain normal blood pressure. DIET: Limit salt intake, read nutrition labels to check salt content, limit fried and high fatty foods  Avoid using multisymptom OTC cold preparations that generally contain sudafed which can rise BP. Consult with pharmacist on best cold relief products to use for persons with HTN EXERCISE Discussed incorporating exercise such as walking - 30 minutes most days of the week and can do in 10 minute intervals    -     CBC  with Differential/Platelet -     CMP14+EGFR  Erectile dysfunction, unspecified erectile dysfunction type See HPI  Cialis  5mg  may take 2 if does not achieve erection-(30)  pt aware  Type 2 diabetes mellitus with hyperlipidemia (HCC)  Healthy lifestyle diet of fruits vegetables fish nuts whole grains and low saturated fat . Foods high in cholesterol or liver, fatty meats,cheese, butter avocados, nuts and seeds, chocolate and fried foods. -     Lipid panel     Patient have been counseled extensively about nutrition and exercise. Other issues discussed during this visit include: low cholesterol diet, weight control and daily exercise, foot care, annual eye examinations at Ophthalmology, importance of adherence with medications and regular follow-up. We also discussed long term complications of uncontrolled diabetes and hypertension.   Return in about 6 weeks (around 10/21/2024).  The patient was given clear instructions to go to ER or return to medical center if symptoms don't improve, worsen or new problems develop. The patient verbalized understanding. The patient was told to call to get lab results if they haven't heard anything in the next week.   This note has been created with Education officer, environmental. Any transcriptional errors are unintentional.   Rosaline SHAUNNA Bohr, NP 09/16/2024, 3:56 PM     [1] No Known Allergies [2]  No current outpatient medications on file prior to visit.   No current facility-administered medications on file prior to visit.   "

## 2024-09-10 LAB — CBC WITH DIFFERENTIAL/PLATELET
Basophils Absolute: 0 10*3/uL (ref 0.0–0.2)
Basos: 1 %
EOS (ABSOLUTE): 0.2 10*3/uL (ref 0.0–0.4)
Eos: 2 %
Hematocrit: 46.1 % (ref 37.5–51.0)
Hemoglobin: 15.2 g/dL (ref 13.0–17.7)
Immature Grans (Abs): 0 10*3/uL (ref 0.0–0.1)
Immature Granulocytes: 0 %
Lymphocytes Absolute: 2.9 10*3/uL (ref 0.7–3.1)
Lymphs: 37 %
MCH: 29.3 pg (ref 26.6–33.0)
MCHC: 33 g/dL (ref 31.5–35.7)
MCV: 89 fL (ref 79–97)
Monocytes Absolute: 0.6 10*3/uL (ref 0.1–0.9)
Monocytes: 7 %
Neutrophils Absolute: 4.1 10*3/uL (ref 1.4–7.0)
Neutrophils: 53 %
Platelets: 237 10*3/uL (ref 150–450)
RBC: 5.18 x10E6/uL (ref 4.14–5.80)
RDW: 13 % (ref 11.6–15.4)
WBC: 7.7 10*3/uL (ref 3.4–10.8)

## 2024-09-10 LAB — CMP14+EGFR
ALT: 52 [IU]/L — ABNORMAL HIGH (ref 0–44)
AST: 32 [IU]/L (ref 0–40)
Albumin: 4.5 g/dL (ref 4.1–5.1)
Alkaline Phosphatase: 84 [IU]/L (ref 47–123)
BUN/Creatinine Ratio: 21 — ABNORMAL HIGH (ref 9–20)
BUN: 15 mg/dL (ref 6–24)
Bilirubin Total: 0.2 mg/dL (ref 0.0–1.2)
CO2: 23 mmol/L (ref 20–29)
Calcium: 9.5 mg/dL (ref 8.7–10.2)
Chloride: 104 mmol/L (ref 96–106)
Creatinine, Ser: 0.71 mg/dL — ABNORMAL LOW (ref 0.76–1.27)
Globulin, Total: 2.3 g/dL (ref 1.5–4.5)
Glucose: 102 mg/dL — ABNORMAL HIGH (ref 70–99)
Potassium: 4.5 mmol/L (ref 3.5–5.2)
Sodium: 139 mmol/L (ref 134–144)
Total Protein: 6.8 g/dL (ref 6.0–8.5)
eGFR: 116 mL/min/{1.73_m2}

## 2024-09-10 LAB — HEMOGLOBIN A1C
Est. average glucose Bld gHb Est-mCnc: 131 mg/dL
Hgb A1c MFr Bld: 6.2 % — ABNORMAL HIGH (ref 4.8–5.6)

## 2024-09-10 LAB — LIPID PANEL
Chol/HDL Ratio: 4.3 ratio (ref 0.0–5.0)
Cholesterol, Total: 163 mg/dL (ref 100–199)
HDL: 38 mg/dL — ABNORMAL LOW
LDL Chol Calc (NIH): 90 mg/dL (ref 0–99)
Triglycerides: 204 mg/dL — ABNORMAL HIGH (ref 0–149)
VLDL Cholesterol Cal: 35 mg/dL (ref 5–40)

## 2024-09-10 LAB — PSA: Prostate Specific Ag, Serum: 0.8 ng/mL (ref 0.0–4.0)

## 2024-10-20 ENCOUNTER — Ambulatory Visit: Payer: Self-pay | Admitting: Primary Care
